# Patient Record
Sex: Female | Born: 1985 | Race: White | Hispanic: No | Marital: Married | Smoking: Never smoker
Health system: Southern US, Community
[De-identification: ages and names within clinical notes are randomized; demographics above are authoritative.]

## PROBLEM LIST (undated history)

## (undated) ENCOUNTER — Inpatient Hospital Stay (HOSPITAL_COMMUNITY): Payer: Self-pay

## (undated) DIAGNOSIS — Z789 Other specified health status: Secondary | ICD-10-CM

## (undated) HISTORY — PX: NO PAST SURGERIES: SHX2092

---

## 2007-05-04 ENCOUNTER — Ambulatory Visit (HOSPITAL_COMMUNITY): Admission: RE | Admit: 2007-05-04 | Discharge: 2007-05-04 | Payer: Self-pay | Admitting: Obstetrics & Gynecology

## 2007-05-10 ENCOUNTER — Inpatient Hospital Stay (HOSPITAL_COMMUNITY): Admission: AD | Admit: 2007-05-10 | Discharge: 2007-05-12 | Payer: Self-pay | Admitting: Obstetrics and Gynecology

## 2007-05-13 ENCOUNTER — Encounter: Admission: RE | Admit: 2007-05-13 | Discharge: 2007-06-01 | Payer: Self-pay | Admitting: Obstetrics and Gynecology

## 2007-06-01 ENCOUNTER — Ambulatory Visit: Admission: RE | Admit: 2007-06-01 | Discharge: 2007-06-01 | Payer: Self-pay | Admitting: Obstetrics and Gynecology

## 2007-06-04 ENCOUNTER — Ambulatory Visit: Admission: RE | Admit: 2007-06-04 | Discharge: 2007-06-04 | Payer: Self-pay | Admitting: Obstetrics and Gynecology

## 2007-09-17 ENCOUNTER — Encounter: Admission: RE | Admit: 2007-09-17 | Discharge: 2007-09-17 | Payer: Self-pay | Admitting: Obstetrics and Gynecology

## 2007-10-22 ENCOUNTER — Encounter: Admission: RE | Admit: 2007-10-22 | Discharge: 2007-10-22 | Payer: Self-pay | Admitting: Obstetrics and Gynecology

## 2008-11-30 ENCOUNTER — Inpatient Hospital Stay (HOSPITAL_COMMUNITY): Admission: AD | Admit: 2008-11-30 | Discharge: 2008-12-02 | Payer: Self-pay | Admitting: Obstetrics and Gynecology

## 2009-07-11 IMAGING — US US OB LIMITED
1 series · 14 of 19 positions shown · non-contrast
Comparison: none

OBSTETRICAL ULTRASOUND:

 This ultrasound exam was performed in the [HOSPITAL] Ultrasound Department.  The OB US report was generated in the AS system, and faxed to the ordering physician.  This report is also available in [REDACTED] PACS.

[Series 1: us ob limited · 0.30mm/px · 14 of 19 slices shown]
[im 1/19]
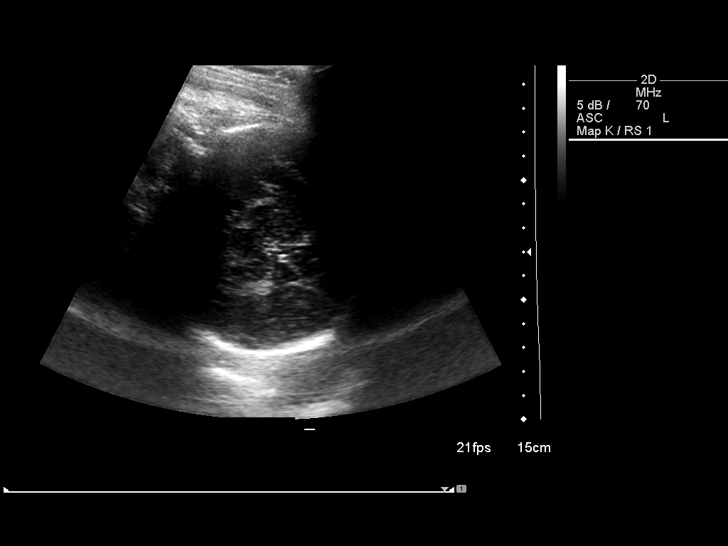
[im 3/19]
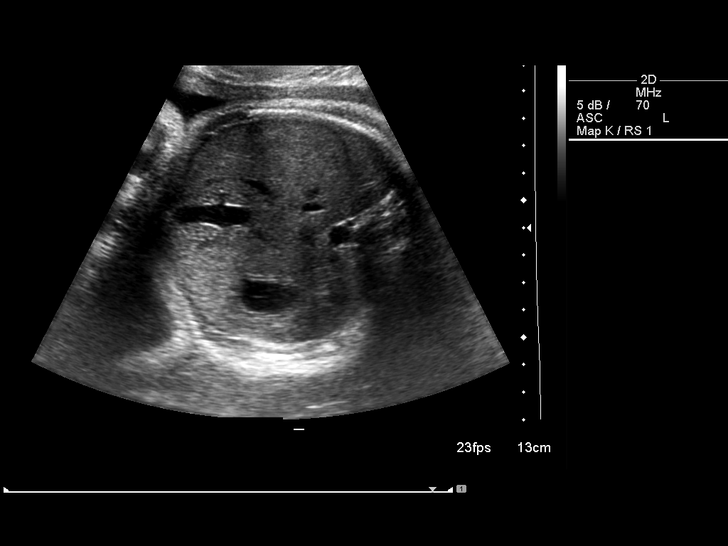
[im 4/19]
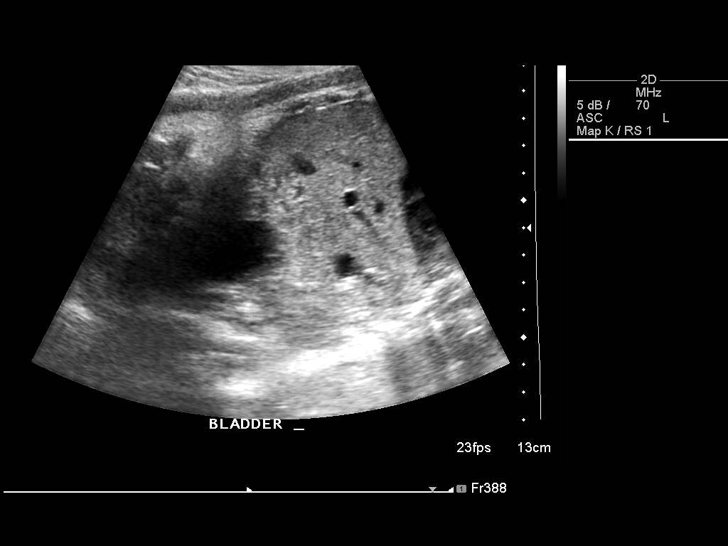
[im 5/19]
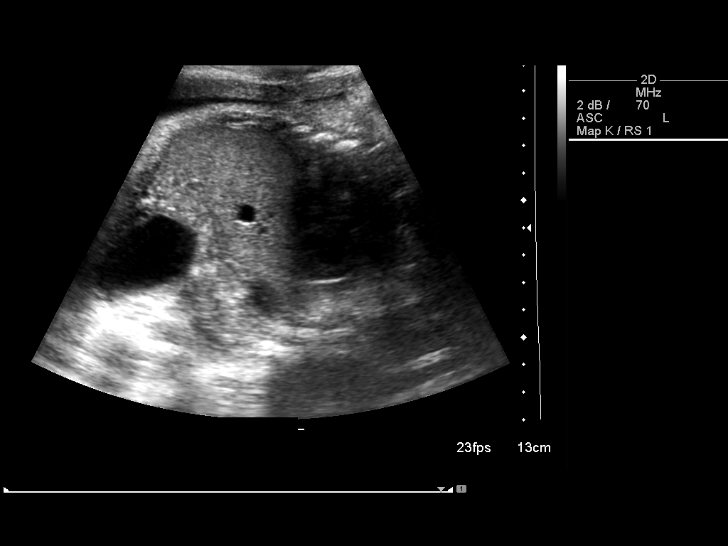
[im 7/19]
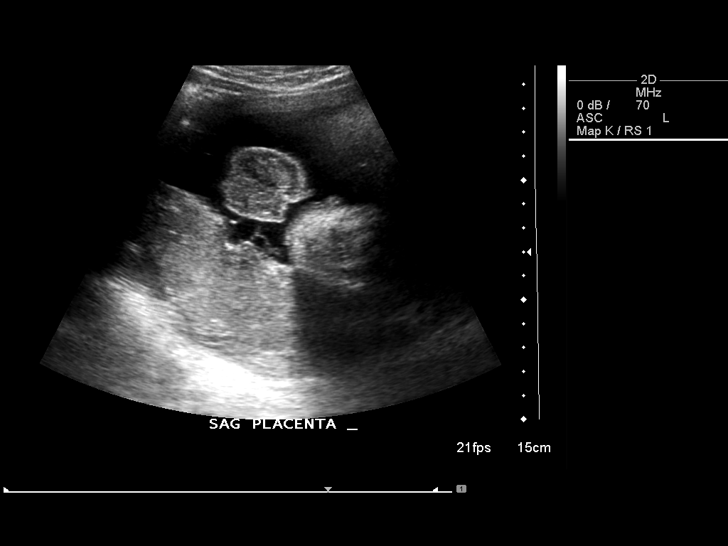
[im 8/19]
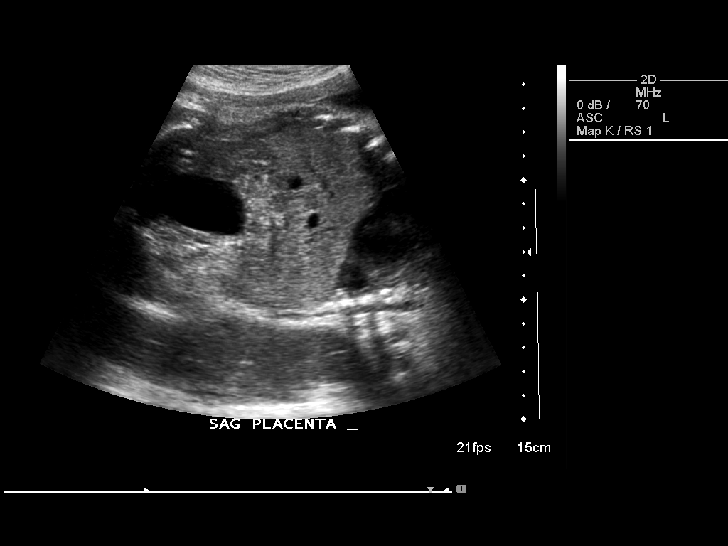
[im 9/19]
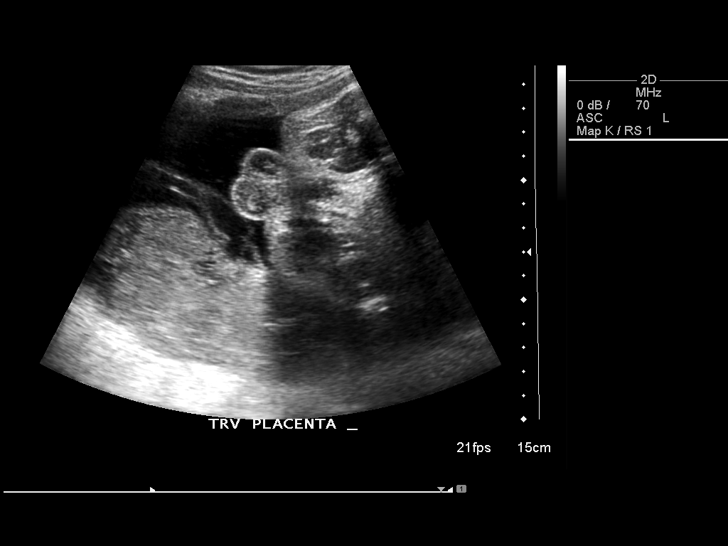
[im 11/19]
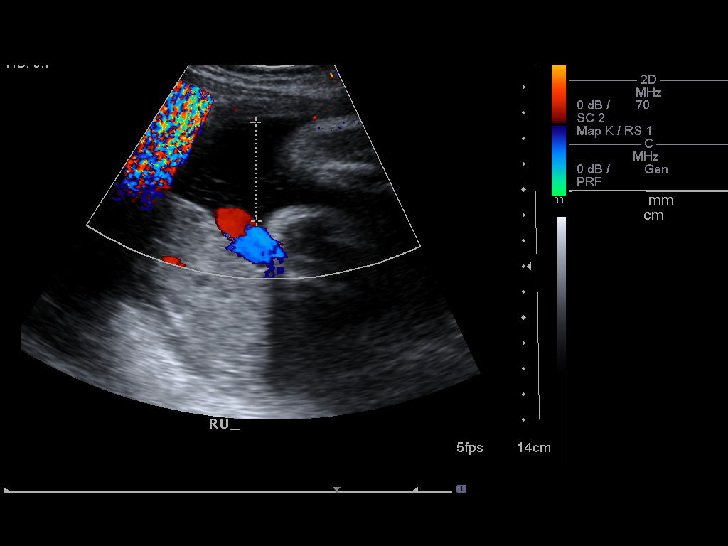
[im 12/19]
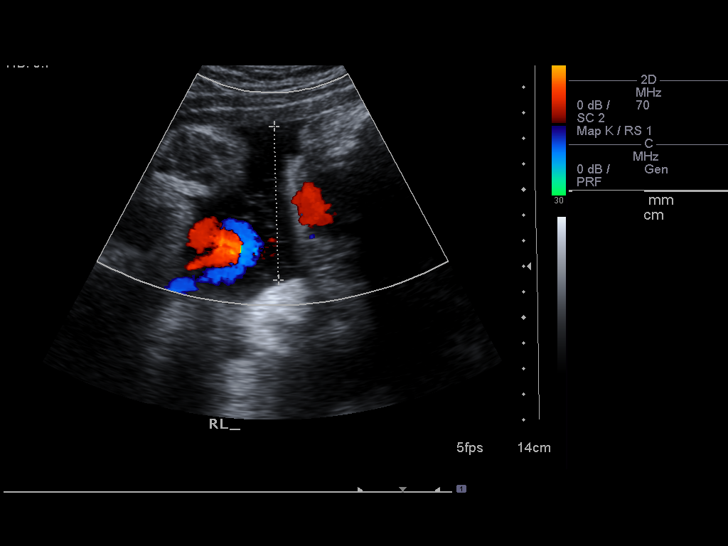
[im 13/19]
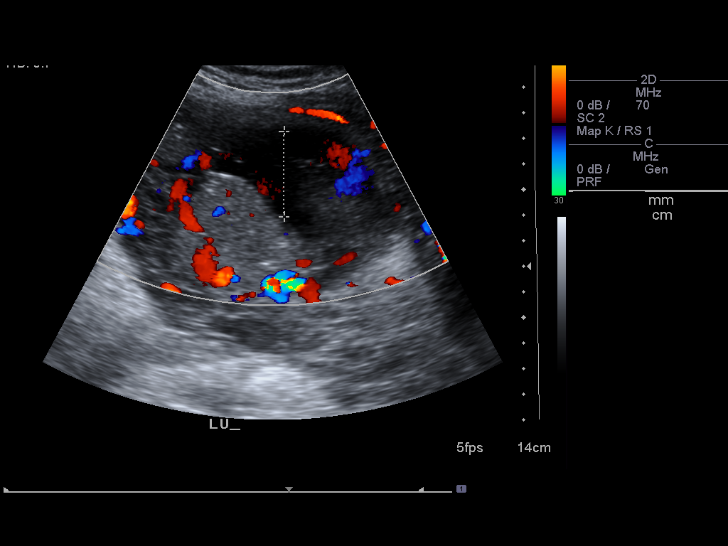
[im 15/19]
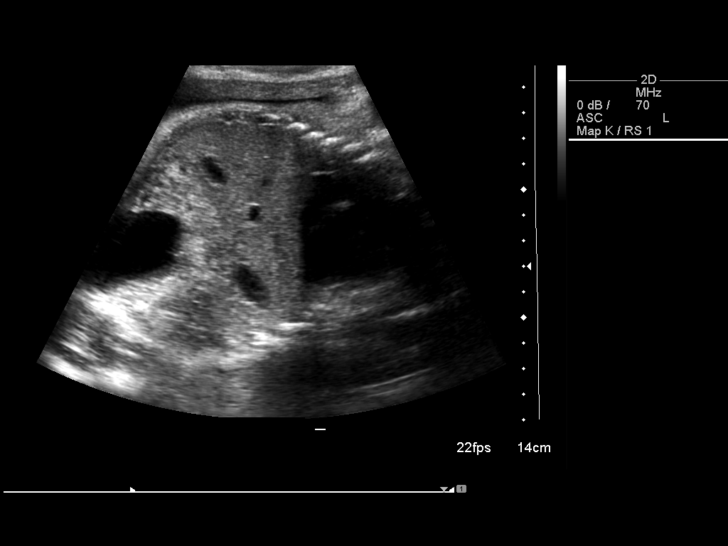
[im 16/19]
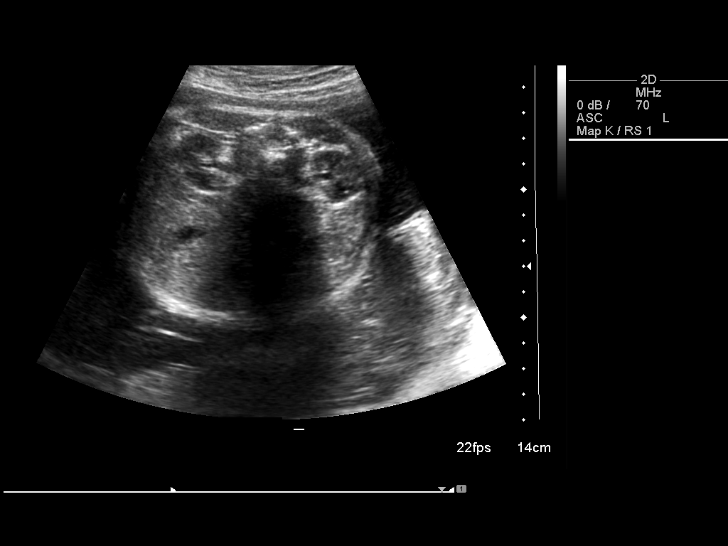
[im 17/19]
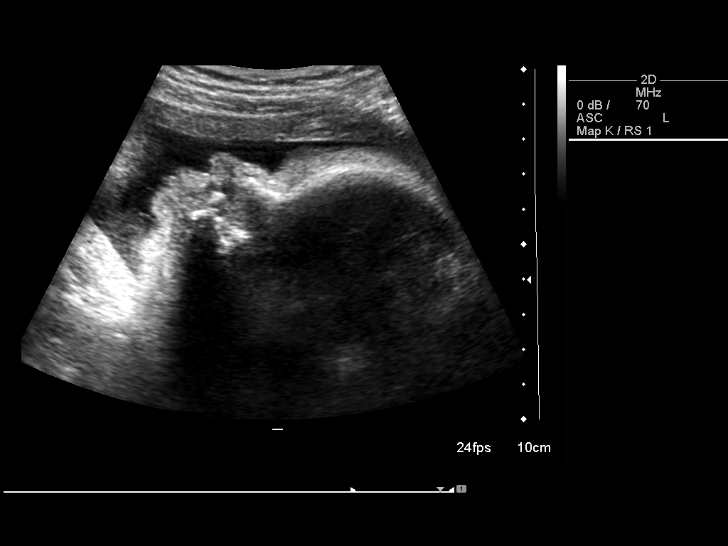
[im 19/19]
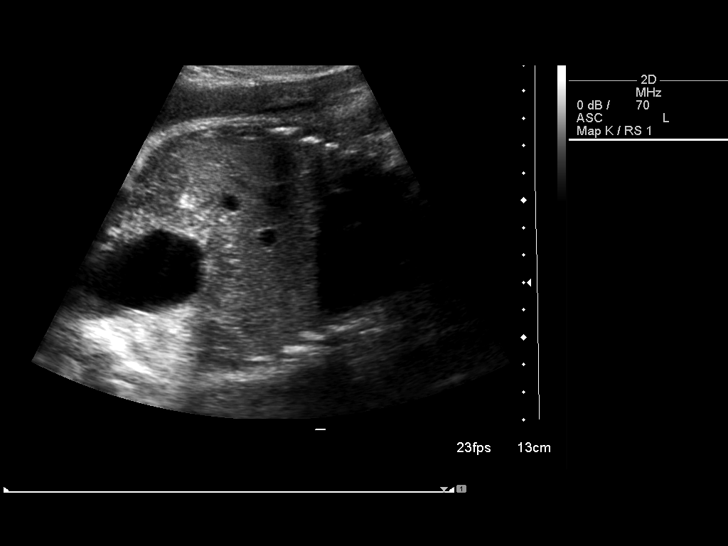

[14 of 19 positions shown; findings below may reference images not displayed]

IMPRESSION: See AS Obstetric US report.

## 2009-12-06 ENCOUNTER — Ambulatory Visit (HOSPITAL_COMMUNITY): Admission: RE | Admit: 2009-12-06 | Discharge: 2009-12-06 | Payer: Self-pay | Admitting: Obstetrics and Gynecology

## 2010-08-05 NOTE — L&D Delivery Note (Signed)
Progressed and had a NSD of a viable female 8 pounds 7 oz., apgars 9/9, over a first degree tear.  Placenta delivered intact.  2A/1V.  Tear repaired with 3-0 rapide.  Breast feed.

## 2010-08-27 ENCOUNTER — Encounter: Payer: Self-pay | Admitting: Obstetrics & Gynecology

## 2010-10-02 ENCOUNTER — Other Ambulatory Visit: Payer: Self-pay | Admitting: Obstetrics and Gynecology

## 2010-10-02 LAB — HEPATITIS B SURFACE ANTIGEN: Hepatitis B Surface Ag: NEGATIVE

## 2010-10-02 LAB — RPR: RPR: NONREACTIVE

## 2010-10-02 LAB — HIV ANTIBODY (ROUTINE TESTING W REFLEX): HIV: NONREACTIVE

## 2010-10-02 LAB — ANTIBODY SCREEN: Antibody Screen: NEGATIVE

## 2010-10-02 LAB — RUBELLA ANTIBODY, IGM: Rubella: IMMUNE

## 2010-10-23 LAB — CBC
HCT: 36.3 % (ref 36.0–46.0)
Hemoglobin: 12.3 g/dL (ref 12.0–15.0)
MCHC: 34 g/dL (ref 30.0–36.0)
MCV: 88.7 fL (ref 78.0–100.0)
Platelets: 133 10*3/uL — ABNORMAL LOW (ref 150–400)
RBC: 4.09 MIL/uL (ref 3.87–5.11)
RDW: 13.9 % (ref 11.5–15.5)
WBC: 6.4 10*3/uL (ref 4.0–10.5)

## 2010-10-23 LAB — ABO/RH: ABO/RH(D): O POS

## 2010-11-14 LAB — CBC
HCT: 31.1 % — ABNORMAL LOW (ref 36.0–46.0)
HCT: 36.3 % (ref 36.0–46.0)
Hemoglobin: 10.6 g/dL — ABNORMAL LOW (ref 12.0–15.0)
Hemoglobin: 12.3 g/dL (ref 12.0–15.0)
MCHC: 34 g/dL (ref 30.0–36.0)
MCHC: 34.2 g/dL (ref 30.0–36.0)
MCV: 91.1 fL (ref 78.0–100.0)
MCV: 91.4 fL (ref 78.0–100.0)
Platelets: 131 10*3/uL — ABNORMAL LOW (ref 150–400)
Platelets: 139 10*3/uL — ABNORMAL LOW (ref 150–400)
RBC: 3.4 MIL/uL — ABNORMAL LOW (ref 3.87–5.11)
RBC: 3.99 MIL/uL (ref 3.87–5.11)
RDW: 13.7 % (ref 11.5–15.5)
RDW: 13.8 % (ref 11.5–15.5)
WBC: 10.5 10*3/uL (ref 4.0–10.5)
WBC: 14.8 10*3/uL — ABNORMAL HIGH (ref 4.0–10.5)

## 2010-11-14 LAB — RPR: RPR Ser Ql: NONREACTIVE

## 2010-12-18 NOTE — Discharge Summary (Signed)
NAMEKAMAREE, BERKEL               ACCOUNT NO.:  [id number]   MEDICAL RECORD NO.:  [id number]          PATIENT TYPE:  INP   LOCATION:  9106                          FACILITY:  [facility]   PHYSICIAN:  Gerrit Friends. Aldona Bar, M.D.   DATE OF BIRTH:  1986-01-04   DATE OF ADMISSION:  11/30/2008  DATE OF DISCHARGE:  12/02/2008                               DISCHARGE SUMMARY   DISCHARGE DIAGNOSES:  1. Term pregnancy, delivered, 7 pounds and 11 ounces female infant,      Apgars 8 and 9.  2. Blood type O positive.  3. Positive group B strep antenatally.   PROCEDURE:  1. Normal spontaneous delivery.  2. First-degree tear and repair.   SUMMARY:  This 25 year old gravida 3, now para 2 was admitted at 44 plus  weeks' gestation in labor.  Her pregnancy was relatively benign.  She  was treated with intrapartum antibiotics because of group B strep  positive status.  She progressed and subsequently had a normal  spontaneous delivery with 7 pounds and 11 ounces female infant over a  first-degree tear which was repaired without difficulty and her  postpartum course was totally benign.  On the morning of December 02, 2008,  she was ambulating well, tolerating a regular diet well, having normal  bowel and bladder function, was breast-feeding without difficulty, was  desirous for discharge.  Accordingly, she was given all appropriate  instructions and understood all instructions well.  She was given a  discharge brochure at the time of discharge.   MEDICATIONS AT DISCHARGE:  1. Tylenol or Advil as needed for discomfort.  2. She will continue her vitamins - one a day as long as she is breast      feeding.  3. Will use Feosol capsules - one approximately 4 times a week for      mild anemia - her CBC at the time of discharge was hemoglobin 10.6,      white count 14,800, and platelet count 139,000.   She will return to the office in approximately 4 weeks' time for  followup or as needed.   CONDITION ON DISCHARGE:   Improved.      Gerrit Friends. Aldona Bar, M.D.  Electronically Signed     RMW/MEDQ  D:  12/02/2008  T:  12/02/2008  Job:  [id number]

## 2011-01-16 LAB — RPR: RPR: NONREACTIVE

## 2011-04-02 LAB — STREP B DNA PROBE: GBS: POSITIVE

## 2011-04-11 ENCOUNTER — Inpatient Hospital Stay (HOSPITAL_COMMUNITY): Payer: BC Managed Care – PPO | Admitting: Anesthesiology

## 2011-04-11 ENCOUNTER — Encounter (HOSPITAL_COMMUNITY): Payer: Self-pay | Admitting: Anesthesiology

## 2011-04-11 ENCOUNTER — Inpatient Hospital Stay (HOSPITAL_COMMUNITY)
Admission: AD | Admit: 2011-04-11 | Discharge: 2011-04-12 | DRG: 373 | Disposition: A | Discharge status: Home / Self Care | Payer: BC Managed Care – PPO | Source: Ambulatory Visit | Attending: Obstetrics & Gynecology | Admitting: Obstetrics & Gynecology

## 2011-04-11 ENCOUNTER — Encounter (HOSPITAL_COMMUNITY): Payer: Self-pay

## 2011-04-11 DIAGNOSIS — Z22330 Carrier of Group B streptococcus: Secondary | ICD-10-CM

## 2011-04-11 DIAGNOSIS — O99892 Other specified diseases and conditions complicating childbirth: Secondary | ICD-10-CM | POA: Diagnosis present

## 2011-04-11 LAB — CBC
HCT: 37 % (ref 36.0–46.0)
Hemoglobin: 12.1 g/dL (ref 12.0–15.0)
MCH: 29.1 pg (ref 26.0–34.0)
MCHC: 32.7 g/dL (ref 30.0–36.0)
MCV: 88.9 fL (ref 78.0–100.0)
Platelets: 133 10*3/uL — ABNORMAL LOW (ref 150–400)
RBC: 4.16 MIL/uL (ref 3.87–5.11)
RDW: 14.1 % (ref 11.5–15.5)
WBC: 9.4 10*3/uL (ref 4.0–10.5)

## 2011-04-11 LAB — RPR: RPR Ser Ql: NONREACTIVE

## 2011-04-11 MED ORDER — CITRIC ACID-SODIUM CITRATE 334-500 MG/5ML PO SOLN: 30.0000 mL | ORAL | Status: DC | PRN

## 2011-04-11 MED ORDER — OXYTOCIN BOLUS FROM INFUSION
500.0000 mL | Freq: Once | INTRAVENOUS | Status: DC
  Filled 2011-04-11: qty 1000
  Filled 2011-04-11: qty 500

## 2011-04-11 MED ORDER — DIPHENHYDRAMINE HCL 50 MG/ML IJ SOLN: 12.5000 mg | INTRAMUSCULAR | Status: DC | PRN

## 2011-04-11 MED ORDER — PENICILLIN G POTASSIUM 5000000 UNITS IJ SOLR
5.0000 10*6.[IU] | Freq: Once | INTRAMUSCULAR | Status: DC
  Filled 2011-04-11: qty 5

## 2011-04-11 MED ORDER — OXYCODONE-ACETAMINOPHEN 5-325 MG PO TABS: 2.0000 | ORAL_TABLET | ORAL | Status: DC | PRN

## 2011-04-11 MED ORDER — LIDOCAINE HCL (PF) 1 % IJ SOLN
30.0000 mL | INTRAMUSCULAR | Status: DC | PRN
  Filled 2011-04-11 (×2): qty 30

## 2011-04-11 MED ORDER — ONDANSETRON HCL 4 MG/2ML IJ SOLN: 4.0000 mg | INTRAMUSCULAR | Status: DC | PRN

## 2011-04-11 MED ORDER — OXYTOCIN 20 UNITS IN LACTATED RINGERS INFUSION - SIMPLE: 1.0000 m[IU]/min | INTRAVENOUS | Status: DC

## 2011-04-11 MED ORDER — LACTATED RINGERS IV SOLN
500.0000 mL | Freq: Once | INTRAVENOUS | Status: AC
  Administered 2011-04-11: 1000 mL via INTRAVENOUS

## 2011-04-11 MED ORDER — LACTATED RINGERS IV SOLN
INTRAVENOUS | Status: DC
  Administered 2011-04-11: 13:00:00 via INTRAVENOUS

## 2011-04-11 MED ORDER — PENICILLIN G POTASSIUM 5000000 UNITS IJ SOLR
2.5000 10*6.[IU] | INTRAVENOUS | Status: DC
  Filled 2011-04-11 (×4): qty 2.5

## 2011-04-11 MED ORDER — FLEET ENEMA 7-19 GM/118ML RE ENEM: 1.0000 | ENEMA | RECTAL | Status: DC | PRN

## 2011-04-11 MED ORDER — FENTANYL 2.5 MCG/ML BUPIVACAINE 1/10 % EPIDURAL INFUSION (WH - ANES)
14.0000 mL/h | INTRAMUSCULAR | Status: DC
  Administered 2011-04-11: 14 mL/h via EPIDURAL
  Filled 2011-04-11: qty 60

## 2011-04-11 MED ORDER — WITCH HAZEL-GLYCERIN EX PADS: 1.0000 "application " | MEDICATED_PAD | CUTANEOUS | Status: DC | PRN

## 2011-04-11 MED ORDER — ZOLPIDEM TARTRATE 5 MG PO TABS: 5.0000 mg | ORAL_TABLET | Freq: Every evening | ORAL | Status: DC | PRN

## 2011-04-11 MED ORDER — LANOLIN HYDROUS EX OINT: TOPICAL_OINTMENT | CUTANEOUS | Status: DC | PRN

## 2011-04-11 MED ORDER — TERBUTALINE SULFATE 1 MG/ML IJ SOLN: 0.2500 mg | Freq: Once | INTRAMUSCULAR | Status: DC | PRN

## 2011-04-11 MED ORDER — PHENYLEPHRINE 40 MCG/ML (10ML) SYRINGE FOR IV PUSH (FOR BLOOD PRESSURE SUPPORT)
80.0000 ug | PREFILLED_SYRINGE | INTRAVENOUS | Status: DC | PRN
  Filled 2011-04-11 (×2): qty 5

## 2011-04-11 MED ORDER — SIMETHICONE 80 MG PO CHEW: 80.0000 mg | CHEWABLE_TABLET | ORAL | Status: DC | PRN

## 2011-04-11 MED ORDER — PHENYLEPHRINE 40 MCG/ML (10ML) SYRINGE FOR IV PUSH (FOR BLOOD PRESSURE SUPPORT)
80.0000 ug | PREFILLED_SYRINGE | INTRAVENOUS | Status: DC | PRN
  Filled 2011-04-11: qty 5

## 2011-04-11 MED ORDER — ONDANSETRON HCL 4 MG PO TABS: 4.0000 mg | ORAL_TABLET | ORAL | Status: DC | PRN

## 2011-04-11 MED ORDER — SODIUM BICARBONATE 8.4 % IV SOLN
INTRAVENOUS | Status: DC | PRN
  Administered 2011-04-11: 5 mL via EPIDURAL

## 2011-04-11 MED ORDER — IBUPROFEN 600 MG PO TABS
600.0000 mg | ORAL_TABLET | Freq: Four times a day (QID) | ORAL | Status: DC
  Administered 2011-04-12 (×3): 600 mg via ORAL
  Filled 2011-04-11 (×3): qty 1

## 2011-04-11 MED ORDER — IBUPROFEN 600 MG PO TABS
600.0000 mg | ORAL_TABLET | Freq: Four times a day (QID) | ORAL | Status: DC | PRN
  Administered 2011-04-11: 600 mg via ORAL
  Filled 2011-04-11: qty 1

## 2011-04-11 MED ORDER — SENNOSIDES-DOCUSATE SODIUM 8.6-50 MG PO TABS: 2.0000 | ORAL_TABLET | Freq: Every day | ORAL | Status: DC

## 2011-04-11 MED ORDER — LACTATED RINGERS IV SOLN: 500.0000 mL | INTRAVENOUS | Status: DC | PRN

## 2011-04-11 MED ORDER — DIPHENHYDRAMINE HCL 25 MG PO CAPS: 25.0000 mg | ORAL_CAPSULE | Freq: Four times a day (QID) | ORAL | Status: DC | PRN

## 2011-04-11 MED ORDER — PRENATAL PLUS 27-1 MG PO TABS
1.0000 | ORAL_TABLET | Freq: Every day | ORAL | Status: DC
  Administered 2011-04-12: 1 via ORAL
  Filled 2011-04-11: qty 1

## 2011-04-11 MED ORDER — EPHEDRINE 5 MG/ML INJ
10.0000 mg | INTRAVENOUS | Status: DC | PRN
  Filled 2011-04-11 (×2): qty 4

## 2011-04-11 MED ORDER — PENICILLIN G POTASSIUM 5000000 UNITS IJ SOLR: 5.0000 10*6.[IU] | INTRAMUSCULAR | Status: DC

## 2011-04-11 MED ORDER — OXYCODONE-ACETAMINOPHEN 5-325 MG PO TABS
1.0000 | ORAL_TABLET | ORAL | Status: DC | PRN
  Administered 2011-04-12: 1 via ORAL
  Filled 2011-04-11: qty 1

## 2011-04-11 MED ORDER — ACETAMINOPHEN 325 MG PO TABS: 650.0000 mg | ORAL_TABLET | ORAL | Status: DC | PRN

## 2011-04-11 MED ORDER — DIBUCAINE 1 % RE OINT: 1.0000 "application " | TOPICAL_OINTMENT | RECTAL | Status: DC | PRN

## 2011-04-11 MED ORDER — TETANUS-DIPHTH-ACELL PERTUSSIS 5-2.5-18.5 LF-MCG/0.5 IM SUSP: 0.5000 mL | Freq: Once | INTRAMUSCULAR | Status: DC

## 2011-04-11 MED ORDER — EPHEDRINE 5 MG/ML INJ
10.0000 mg | INTRAVENOUS | Status: DC | PRN
  Filled 2011-04-11: qty 4

## 2011-04-11 MED ORDER — PENICILLIN G POTASSIUM 5000000 UNITS IJ SOLR
5.0000 10*6.[IU] | Freq: Once | INTRAVENOUS | Status: AC
  Administered 2011-04-11: 5 10*6.[IU] via INTRAVENOUS
  Filled 2011-04-11: qty 5

## 2011-04-11 MED ORDER — ONDANSETRON HCL 4 MG/2ML IJ SOLN: 4.0000 mg | Freq: Four times a day (QID) | INTRAMUSCULAR | Status: DC | PRN

## 2011-04-11 MED ORDER — OXYTOCIN 20 UNITS IN LACTATED RINGERS INFUSION - SIMPLE
125.0000 mL/h | Freq: Once | INTRAVENOUS | Status: AC
  Administered 2011-04-11: 999 mL/h via INTRAVENOUS

## 2011-04-11 MED ORDER — BENZOCAINE-MENTHOL 20-0.5 % EX AERO: 1.0000 "application " | INHALATION_SPRAY | CUTANEOUS | Status: DC | PRN

## 2011-04-11 NOTE — Anesthesia Procedure Notes (Addendum)
Epidural Patient location during procedure: OB Start time: 04/11/2011 2:16 PM  Staffing Anesthesiologist: Jiles Garter  Preanesthetic Checklist Completed: patient identified, site marked, surgical consent, pre-op evaluation, timeout performed, IV checked, risks and benefits discussed and monitors and equipment checked  Epidural Patient position: sitting Prep: site prepped and draped and DuraPrep Patient monitoring: continuous pulse ox and blood pressure Approach: midline Injection technique: LOR air  Needle:  Needle type: Tuohy  Needle gauge: 17 G Needle length: 9 cm Needle insertion depth: 5 cm cm Catheter type: closed end flexible Catheter size: 19 Gauge Catheter at skin depth: 10 cm Test dose: negative  Assessment Events: blood not aspirated, injection not painful, no injection resistance, negative IV test and no paresthesia  Additional Notes Dosing of Epidural: 1st dose, through needle...... ( mg expressed as equavilent  cc's medication  from .1%Bupiv / fentanyl syringe from L&D pump)...............  5mg  Marcaine  2nd dose, through catheter after waiting 3 minutes.... epi 1:200K + Xylocaine 40 mg 3rd dose, through catheter, after waiting 3 minutes...Marland KitchenMarland Kitchenepi 1:200K + Xylocaine 60 mg ( 2% Xylo charted as a single dose in Epic Meds for ease of charting; actual dosing was fractionated as above, for saftey's sake)  As each dose occurred, patient was free of IV sx; and patient exhibited no evidence of SA injection.  Patient is more comfortable after epidural dosed. Please see RN's note for documentation of vital signs,and FHR which are stable.

## 2011-04-11 NOTE — Progress Notes (Signed)
First dose of IV PCN in, epidural in, and patient comfortable.  My exam -- Cx 6-/90/ vtx -1-2 and contraction pattern to me poor.  Suggested AROM and/or pitocin augmentation.  Awaiting decision of patient and husband.

## 2011-04-11 NOTE — Anesthesia Preprocedure Evaluation (Signed)
Anesthesia Evaluation  Name, MR# and DOB Patient awake  General Assessment Comment  Reviewed: Allergy & Precautions, H&P , Patient's Chart, lab work & pertinent test results  Airway Mallampati: II TM Distance: >3 FB Neck ROM: full    Dental  (+) Teeth Intact   Pulmonary  clear to auscultation  breath sounds clear to auscultation none    Cardiovascular regular Normal    Neuro/Psych   GI/Hepatic/Renal   Endo/Other    Abdominal   Musculoskeletal   Hematology   Peds  Reproductive/Obstetrics (+) Pregnancy    Anesthesia Other Findings                 Anesthesia Physical Anesthesia Plan  ASA: II  Anesthesia Plan: Epidural   Post-op Pain Management:    Induction:   Airway Management Planned:   Additional Equipment:   Intra-op Plan:   Post-operative Plan:   Informed Consent: I have reviewed the patients History and Physical, chart, labs and discussed the procedure including the risks, benefits and alternatives for the proposed anesthesia with the patient or authorized representative who has indicated his/her understanding and acceptance.   Dental Advisory Given  Plan Discussed with: CRNA and Surgeon  Anesthesia Plan Comments: (Labs checked- platelets confirmed with RN in room. Fetal heart tracing, per RN, reportedly stable enough for sitting procedure. Discussed epidural, and patient consents to the procedure:  included risk of possible headache,backache, failed block, allergic reaction, and nerve injury. This patient was asked if she had any questions or concerns before the procedure started. )        Anesthesia Quick Evaluation

## 2011-04-11 NOTE — H&P (Signed)
  25 year old G5P2 at 39+ weeks in labor.  +GBS in urine.  Otherwise benign pregnancy.  Started contracting about 4 hours ago.  PE:  VS stable Abd:  EFW 7-8 pounds; FH reactive; Vtx CX:  (by nurse) 6/100/vtx  IMP:  Term IUP, active labor, +GBS PLAN:  IV PCN, epidural as requested, expectant management.

## 2011-04-11 NOTE — Progress Notes (Signed)
Pt states contractions every 4-10 minutes. No bleeding or leaking and reports good fetal movement,.

## 2011-04-11 NOTE — Progress Notes (Deleted)
Pt states she has had no prenatal care. Has been seen in MAU before. Has had a cold for about 3 days and having R upper chest and arm pain, lower abdominal pain that is worse with movement. No bleeding or leaking fluid and has good fetal movement.

## 2011-04-12 LAB — CBC
HCT: 33.4 % — ABNORMAL LOW (ref 36.0–46.0)
Hemoglobin: 11 g/dL — ABNORMAL LOW (ref 12.0–15.0)
MCH: 29.4 pg (ref 26.0–34.0)
MCHC: 32.9 g/dL (ref 30.0–36.0)
MCV: 89.3 fL (ref 78.0–100.0)
Platelets: 138 10*3/uL — ABNORMAL LOW (ref 150–400)
RBC: 3.74 MIL/uL — ABNORMAL LOW (ref 3.87–5.11)
RDW: 14.2 % (ref 11.5–15.5)
WBC: 12.6 10*3/uL — ABNORMAL HIGH (ref 4.0–10.5)

## 2011-04-12 MED ORDER — HYDROCODONE-ACETAMINOPHEN 5-500 MG PO TABS: 1.0000 | ORAL_TABLET | ORAL | Status: AC | PRN

## 2011-04-12 MED ORDER — IBUPROFEN 600 MG PO TABS: 600.0000 mg | ORAL_TABLET | Freq: Four times a day (QID) | ORAL | Status: AC | PRN

## 2011-04-12 NOTE — Progress Notes (Signed)
Post Partum Day 1 Subjective: no complaints, up ad lib, voiding, tolerating PO and + flatus Patient desires early discharge home today once the baby is 24 hours old  Objective: Blood pressure 93/65, pulse 87, temperature 97.8 F (36.6 C), temperature source Oral, resp. rate 18, height 5\' 2"  (1.575 m), weight 70.308 kg (155 lb), SpO2 98.00%, unknown if currently breastfeeding.  Physical Exam:  General: alert, cooperative, appears stated age and no distress Lochia: appropriate Uterine Fundus: firm DVT Evaluation: No evidence of DVT seen on physical exam.   Basename 04/12/11 0510 04/11/11 1312  HGB 11.0* 12.1  HCT 33.4* 37.0    Assessment/Plan: Discharge home and Breastfeeding   LOS: 1 day   Temitope Flammer H. 04/12/2011, 10:45 AM

## 2011-04-12 NOTE — Anesthesia Postprocedure Evaluation (Signed)
  Anesthesia Post-op Note  Patient: Monica Mathis  Procedure(s) Performed: * No procedures listed *  Patient Location: PACU and Mother/Baby  Anesthesia Type: Epidural  Level of Consciousness: awake, alert  and oriented  Airway and Oxygen Therapy: Patient Spontanous Breathing  Post-op Pain: mild  Post-op Assessment: Patient's Cardiovascular Status Stable and Respiratory Function Stable  Post-op Vital Signs: stable  Complications: No apparent anesthesia complications

## 2011-04-12 NOTE — Discharge Summary (Signed)
Obstetric Discharge Summary Reason for Admission: onset of labor Prenatal Procedures: ultrasound Intrapartum Procedures: spontaneous vaginal delivery Postpartum Procedures: none Complications-Operative and Postpartum: 1st degree perineal laceration Hemoglobin  Date Value Range Status  04/12/2011 11.0* 12.0-15.0 (g/dL) Final     HCT  Date Value Range Status  04/12/2011 33.4* 36.0-46.0 (%) Final    Discharge Diagnoses: Term Pregnancy-delivered  Discharge Information: Date: 04/12/2011 Activity: pelvic rest Diet: routine Medications: PNV, Ibuprophen and Viocodin Condition: stable Instructions: refer to practice specific booklet Discharge to: home Follow-up Information    Follow up with Caprice Beaver. Make an appointment in 4 weeks. (for a postpartum evaluation)    Contact information:   [address] [place] [address] [phone]          Newborn Data: Live born female  Birth Weight: 8 lb 7.3 oz (3835 g) APGAR: 9, 9  Home with mother.  Nicolas Banh H. 04/12/2011, 10:54 AM

## 2011-04-17 ENCOUNTER — Inpatient Hospital Stay (HOSPITAL_COMMUNITY): Admission: AD | Admit: 2011-04-17 | Payer: Self-pay | Source: Ambulatory Visit | Admitting: Obstetrics and Gynecology

## 2011-05-16 LAB — CBC
HCT: 30 — ABNORMAL LOW
HCT: 35.7 — ABNORMAL LOW
Hemoglobin: 10.3 — ABNORMAL LOW
Hemoglobin: 12.1
MCHC: 34
MCHC: 34.2
MCV: 89.3
MCV: 89.3
Platelets: 125 — ABNORMAL LOW
Platelets: 137 — ABNORMAL LOW
RBC: 3.35 — ABNORMAL LOW
RBC: 3.99
RDW: 13.4
RDW: 13.4
WBC: 10.5
WBC: 15.1 — ABNORMAL HIGH

## 2011-05-16 LAB — RPR: RPR Ser Ql: NONREACTIVE

## 2012-02-13 IMAGING — US US OB COMP LESS 14 WK
1 series · 14 of 16 positions shown · non-contrast
Comparison: none

OBSTETRICAL ULTRASOUND:
 This ultrasound exam was performed in the [HOSPITAL] Ultrasound Department.  The OB US report was generated in the AS system, and faxed to the ordering physician.  This report is also available in [HOSPITAL]?s [employer] and in [REDACTED] PACS.

[Series 1: us ob transvaginal · 16 acquisitions, 14 frames shown]
[im 1/16]
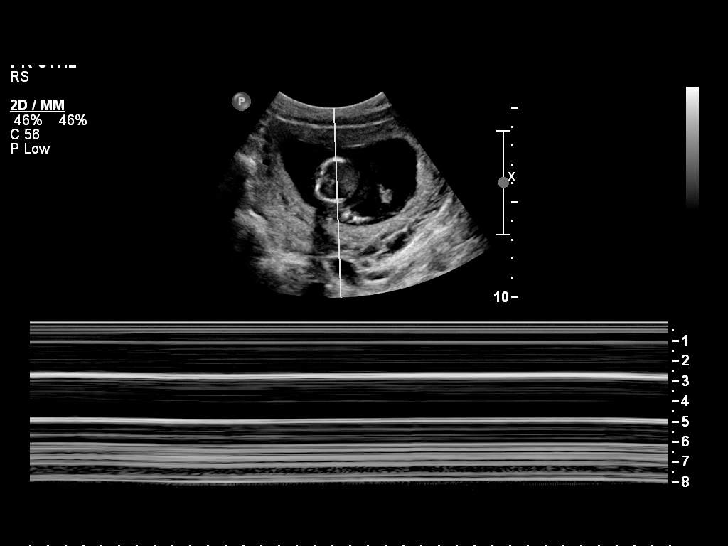
[im 2/16]
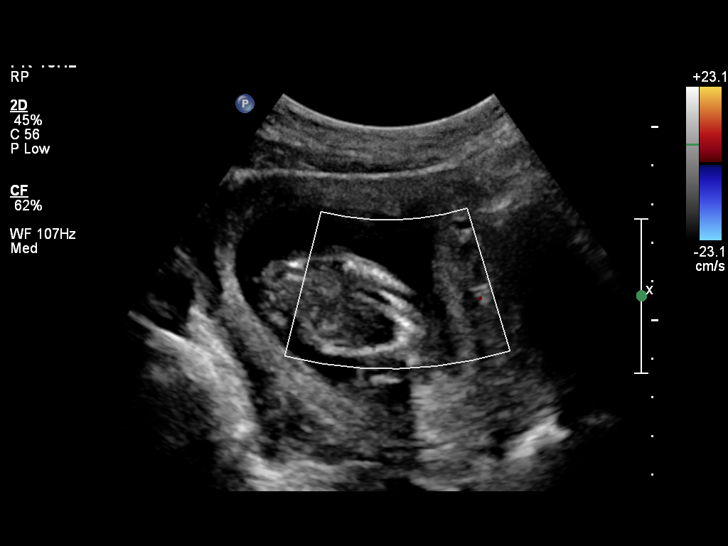
[im 3/16]
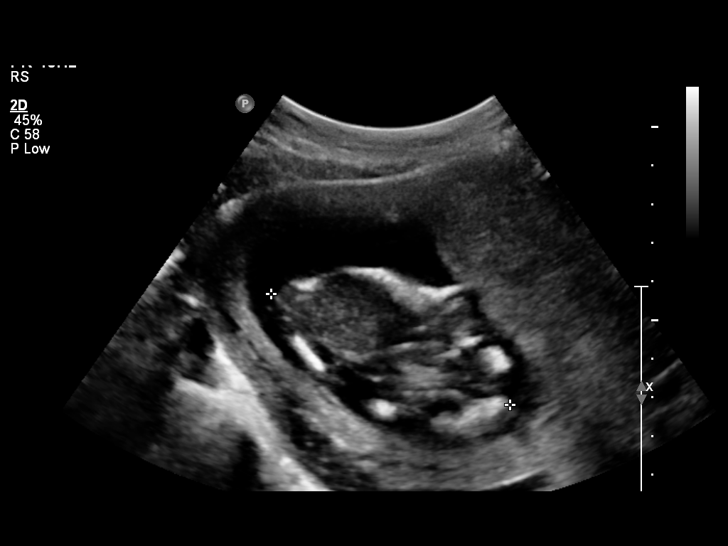
[im 5/16]
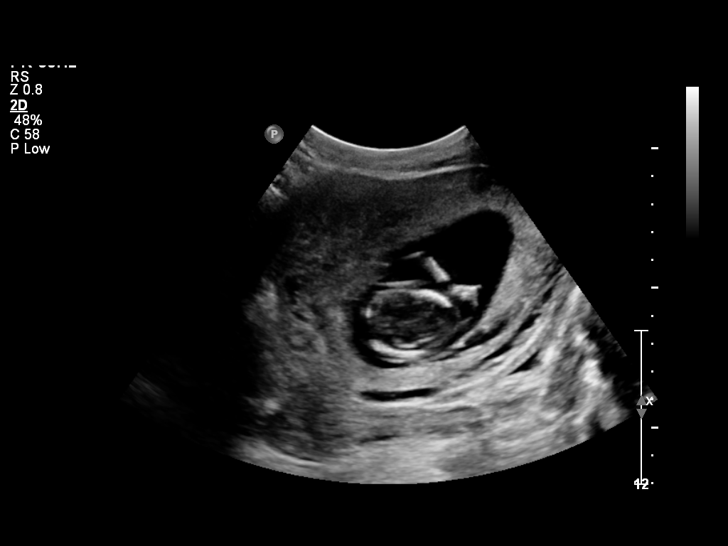
[im 6/16]
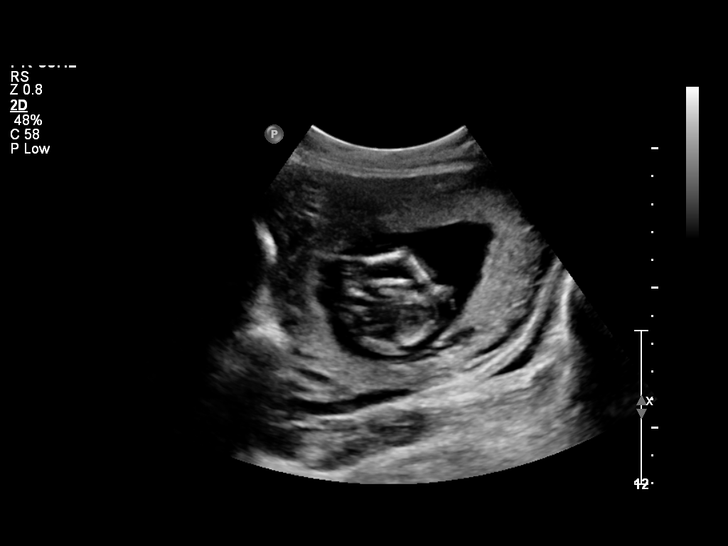
[im 7/16]
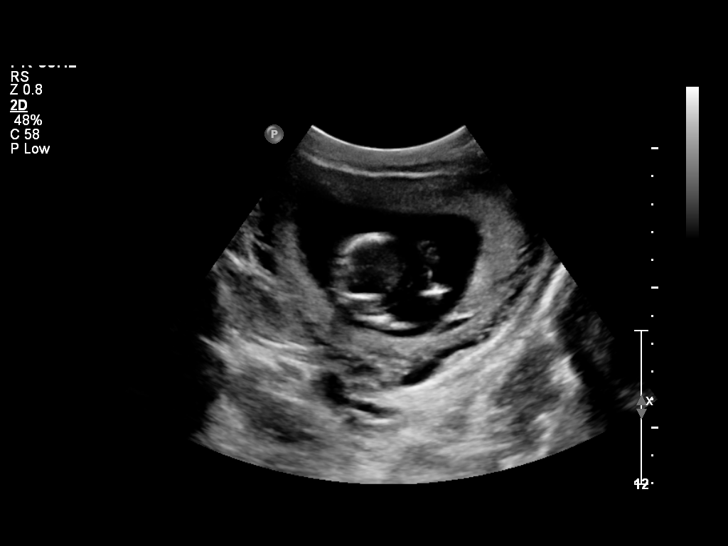
[im 8/16]
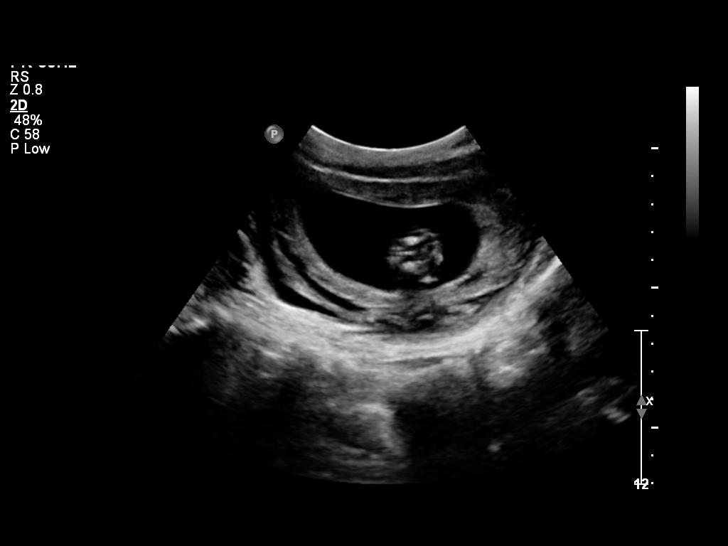
[im 9/16]
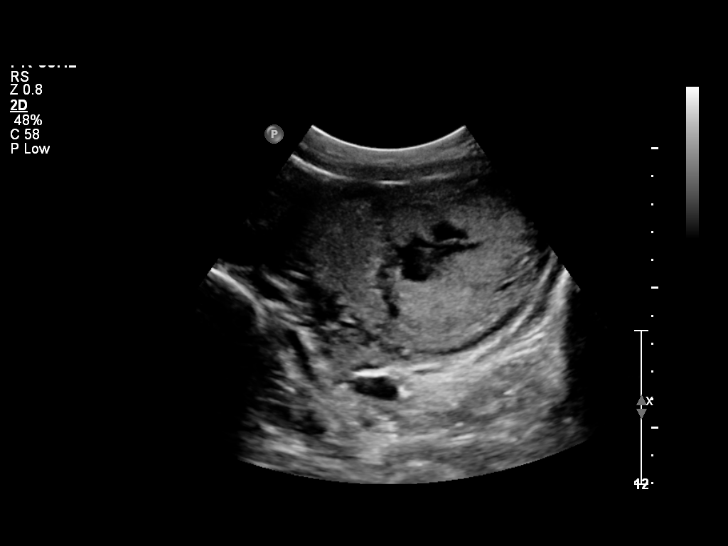
[im 10/16]
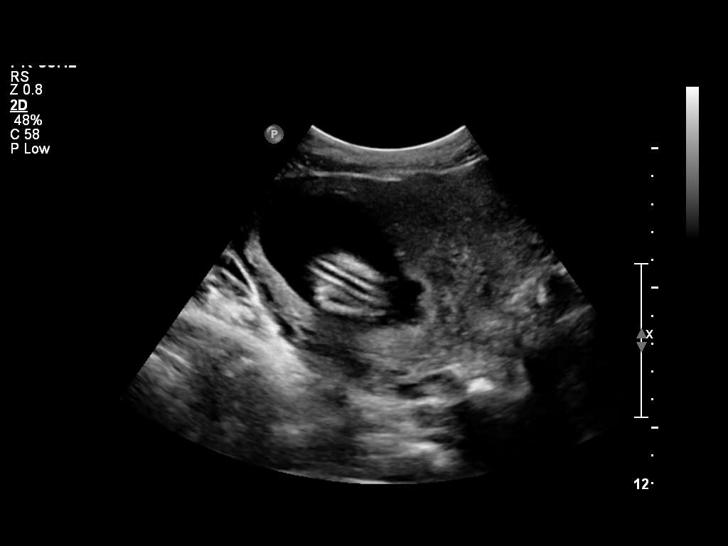
[im 11/16]
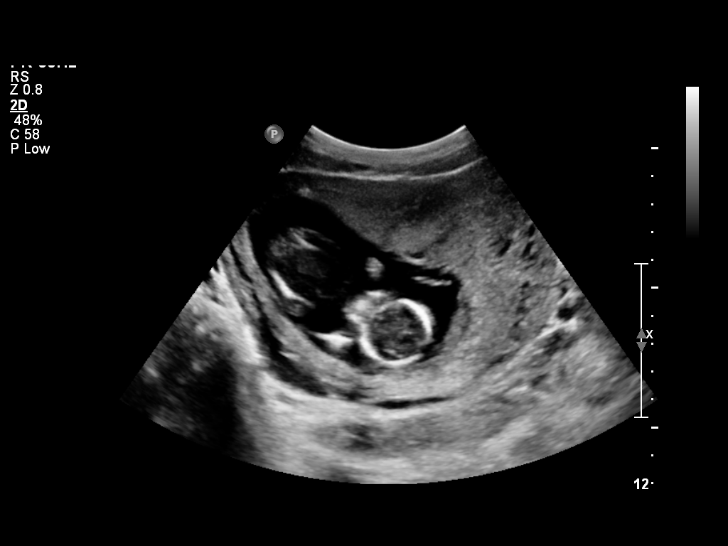
[im 13/16]
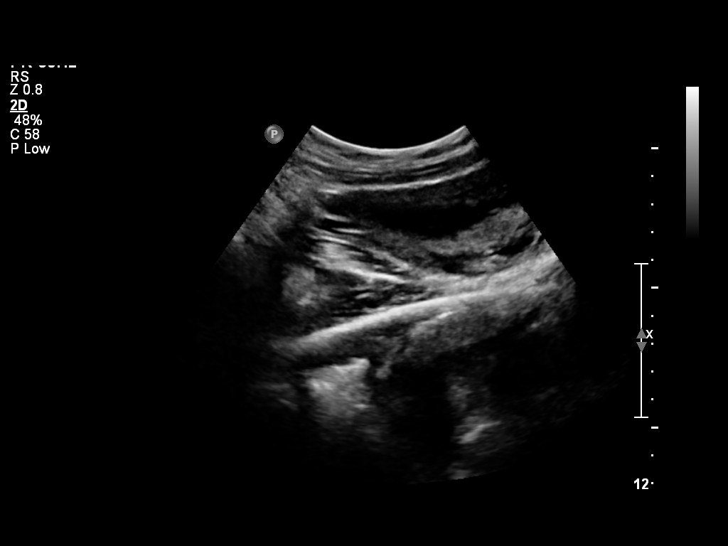
[im 14/16]
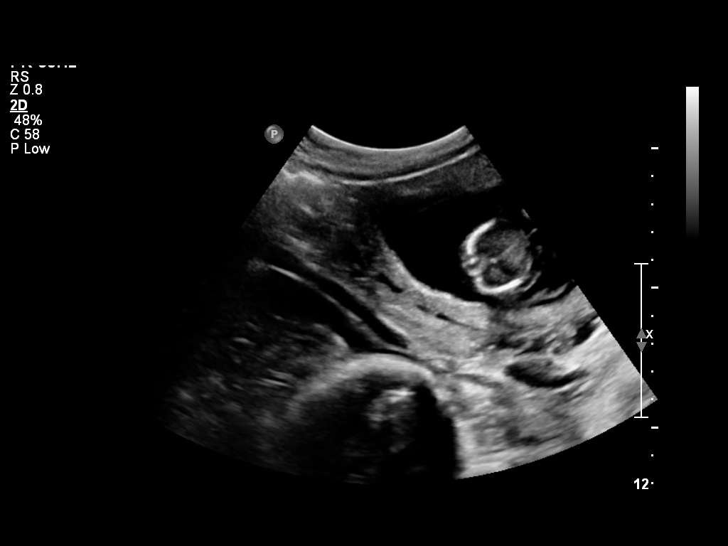
[im 15/16]
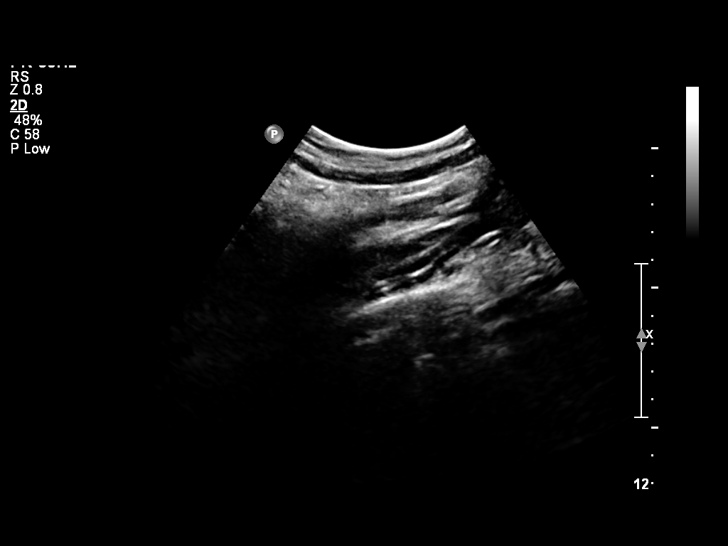
[im 16/16]
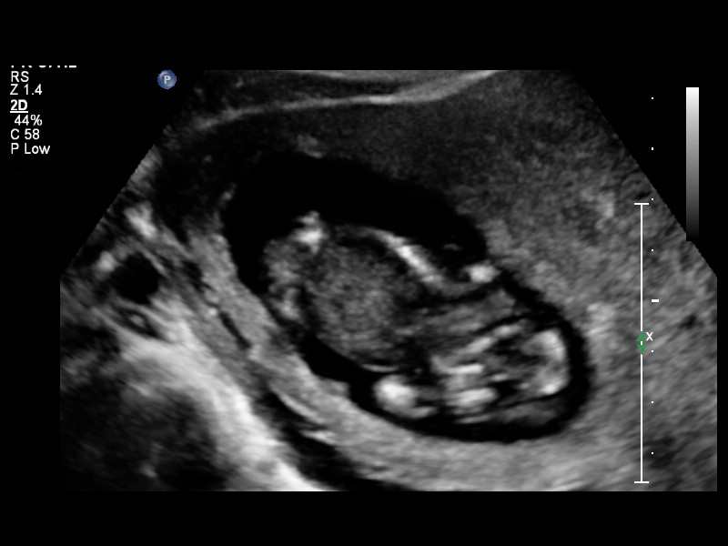

[14 of 16 positions shown; findings below may reference images not displayed]

IMPRESSION: See AS Obstetric US report.

## 2012-08-05 NOTE — L&D Delivery Note (Addendum)
Delivery Note At 8:18 PM a viable and healthy female was delivered spontaneously.  APGAR: 9, 9; weight 6 lbs 14 oz.   Placenta delivered intact with cord traction.  3 vessel cord.  Cervix inspected.  Uterus explored.  Anesthesia:  Epidural Episiotomy: None Lacerations: None Est. Blood Loss (mL): 300 ml  Mom stable and baby placed skin to skin on the maternal abdomen.  Lovena Kluck D 06/02/2013, 8:32 PM

## 2012-12-15 LAB — OB RESULTS CONSOLE HIV ANTIBODY (ROUTINE TESTING): HIV: NONREACTIVE

## 2012-12-15 LAB — OB RESULTS CONSOLE RPR: RPR: NONREACTIVE

## 2013-06-02 ENCOUNTER — Inpatient Hospital Stay (HOSPITAL_COMMUNITY)
Admission: AD | Admit: 2013-06-02 | Discharge: 2013-06-03 | DRG: 775 | Disposition: A | Discharge status: Home / Self Care | Payer: BC Managed Care – PPO | Source: Ambulatory Visit | Attending: Obstetrics and Gynecology | Admitting: Obstetrics and Gynecology

## 2013-06-02 ENCOUNTER — Encounter (HOSPITAL_COMMUNITY): Payer: BC Managed Care – PPO | Admitting: Anesthesiology

## 2013-06-02 ENCOUNTER — Encounter (HOSPITAL_COMMUNITY): Payer: Self-pay

## 2013-06-02 ENCOUNTER — Inpatient Hospital Stay (HOSPITAL_COMMUNITY): Payer: BC Managed Care – PPO | Admitting: Anesthesiology

## 2013-06-02 DIAGNOSIS — O99892 Other specified diseases and conditions complicating childbirth: Principal | ICD-10-CM | POA: Diagnosis present

## 2013-06-02 DIAGNOSIS — Z22330 Carrier of Group B streptococcus: Secondary | ICD-10-CM

## 2013-06-02 LAB — CBC
HCT: 35.7 % — ABNORMAL LOW (ref 36.0–46.0)
Hemoglobin: 12.1 g/dL (ref 12.0–15.0)
MCH: 28.9 pg (ref 26.0–34.0)
MCHC: 33.9 g/dL (ref 30.0–36.0)
MCV: 85.4 fL (ref 78.0–100.0)
Platelets: 126 10*3/uL — ABNORMAL LOW (ref 150–400)
RBC: 4.18 MIL/uL (ref 3.87–5.11)
RDW: 14.1 % (ref 11.5–15.5)
WBC: 9.9 10*3/uL (ref 4.0–10.5)

## 2013-06-02 LAB — POCT FERN TEST: POCT Fern Test: POSITIVE

## 2013-06-02 LAB — OB RESULTS CONSOLE GBS: GBS: POSITIVE

## 2013-06-02 LAB — RPR: RPR Ser Ql: NONREACTIVE

## 2013-06-02 MED ORDER — DIPHENHYDRAMINE HCL 25 MG PO CAPS: 25.0000 mg | ORAL_CAPSULE | Freq: Four times a day (QID) | ORAL | Status: DC | PRN

## 2013-06-02 MED ORDER — DIPHENHYDRAMINE HCL 50 MG/ML IJ SOLN: 12.5000 mg | INTRAMUSCULAR | Status: DC | PRN

## 2013-06-02 MED ORDER — PHENYLEPHRINE 40 MCG/ML (10ML) SYRINGE FOR IV PUSH (FOR BLOOD PRESSURE SUPPORT)
80.0000 ug | PREFILLED_SYRINGE | INTRAVENOUS | Status: DC | PRN
  Filled 2013-06-02: qty 10
  Filled 2013-06-02: qty 2

## 2013-06-02 MED ORDER — ACETAMINOPHEN 325 MG PO TABS: 650.0000 mg | ORAL_TABLET | ORAL | Status: DC | PRN

## 2013-06-02 MED ORDER — BENZOCAINE-MENTHOL 20-0.5 % EX AERO: 1.0000 "application " | INHALATION_SPRAY | CUTANEOUS | Status: DC | PRN

## 2013-06-02 MED ORDER — LIDOCAINE HCL (PF) 1 % IJ SOLN
INTRAMUSCULAR | Status: DC | PRN
  Administered 2013-06-02 (×2): 5 mL

## 2013-06-02 MED ORDER — LACTATED RINGERS IV SOLN
500.0000 mL | INTRAVENOUS | Status: DC | PRN
Start: 2013-06-02 — End: 2013-06-02

## 2013-06-02 MED ORDER — SIMETHICONE 80 MG PO CHEW: 80.0000 mg | CHEWABLE_TABLET | ORAL | Status: DC | PRN

## 2013-06-02 MED ORDER — CITRIC ACID-SODIUM CITRATE 334-500 MG/5ML PO SOLN: 30.0000 mL | ORAL | Status: DC | PRN

## 2013-06-02 MED ORDER — TERBUTALINE SULFATE 1 MG/ML IJ SOLN: 0.2500 mg | Freq: Once | INTRAMUSCULAR | Status: DC | PRN

## 2013-06-02 MED ORDER — PRENATAL MULTIVITAMIN CH
1.0000 | ORAL_TABLET | Freq: Every day | ORAL | Status: DC
  Administered 2013-06-03: 1 via ORAL
  Filled 2013-06-02: qty 1

## 2013-06-02 MED ORDER — TETANUS-DIPHTH-ACELL PERTUSSIS 5-2.5-18.5 LF-MCG/0.5 IM SUSP: 0.5000 mL | Freq: Once | INTRAMUSCULAR | Status: DC

## 2013-06-02 MED ORDER — BUTORPHANOL TARTRATE 1 MG/ML IJ SOLN: 1.0000 mg | INTRAMUSCULAR | Status: DC | PRN

## 2013-06-02 MED ORDER — FLEET ENEMA 7-19 GM/118ML RE ENEM: 1.0000 | ENEMA | RECTAL | Status: DC | PRN

## 2013-06-02 MED ORDER — SENNOSIDES-DOCUSATE SODIUM 8.6-50 MG PO TABS
2.0000 | ORAL_TABLET | ORAL | Status: DC
  Administered 2013-06-03: 2 via ORAL
  Filled 2013-06-02 (×2): qty 2

## 2013-06-02 MED ORDER — EPHEDRINE 5 MG/ML INJ
10.0000 mg | INTRAVENOUS | Status: DC | PRN
  Filled 2013-06-02: qty 2

## 2013-06-02 MED ORDER — LANOLIN HYDROUS EX OINT: TOPICAL_OINTMENT | CUTANEOUS | Status: DC | PRN

## 2013-06-02 MED ORDER — PENICILLIN G POTASSIUM 5000000 UNITS IJ SOLR
2.5000 10*6.[IU] | INTRAVENOUS | Status: DC
  Administered 2013-06-02 (×3): 2.5 10*6.[IU] via INTRAVENOUS
  Filled 2013-06-02 (×5): qty 2.5

## 2013-06-02 MED ORDER — LACTATED RINGERS IV SOLN
INTRAVENOUS | Status: DC
  Administered 2013-06-02 (×3): via INTRAVENOUS

## 2013-06-02 MED ORDER — ONDANSETRON HCL 4 MG/2ML IJ SOLN: 4.0000 mg | INTRAMUSCULAR | Status: DC | PRN

## 2013-06-02 MED ORDER — DIBUCAINE 1 % RE OINT: 1.0000 "application " | TOPICAL_OINTMENT | RECTAL | Status: DC | PRN

## 2013-06-02 MED ORDER — IBUPROFEN 600 MG PO TABS: 600.0000 mg | ORAL_TABLET | Freq: Four times a day (QID) | ORAL | Status: DC | PRN

## 2013-06-02 MED ORDER — ZOLPIDEM TARTRATE 5 MG PO TABS: 5.0000 mg | ORAL_TABLET | Freq: Every evening | ORAL | Status: DC | PRN

## 2013-06-02 MED ORDER — OXYTOCIN BOLUS FROM INFUSION
500.0000 mL | INTRAVENOUS | Status: DC
  Administered 2013-06-02: 500 mL via INTRAVENOUS

## 2013-06-02 MED ORDER — LIDOCAINE HCL (PF) 1 % IJ SOLN
30.0000 mL | INTRAMUSCULAR | Status: DC | PRN
  Filled 2013-06-02: qty 30

## 2013-06-02 MED ORDER — OXYTOCIN 40 UNITS IN LACTATED RINGERS INFUSION - SIMPLE MED: 1.0000 m[IU]/min | INTRAVENOUS | Status: DC

## 2013-06-02 MED ORDER — WITCH HAZEL-GLYCERIN EX PADS: 1.0000 "application " | MEDICATED_PAD | CUTANEOUS | Status: DC | PRN

## 2013-06-02 MED ORDER — ONDANSETRON HCL 4 MG/2ML IJ SOLN: 4.0000 mg | Freq: Four times a day (QID) | INTRAMUSCULAR | Status: DC | PRN

## 2013-06-02 MED ORDER — FENTANYL 2.5 MCG/ML BUPIVACAINE 1/10 % EPIDURAL INFUSION (WH - ANES)
14.0000 mL/h | INTRAMUSCULAR | Status: DC | PRN
  Administered 2013-06-02: 14 mL/h via EPIDURAL
  Filled 2013-06-02: qty 125

## 2013-06-02 MED ORDER — OXYCODONE-ACETAMINOPHEN 5-325 MG PO TABS: 1.0000 | ORAL_TABLET | ORAL | Status: DC | PRN

## 2013-06-02 MED ORDER — LACTATED RINGERS IV SOLN
500.0000 mL | Freq: Once | INTRAVENOUS | Status: AC
  Administered 2013-06-02: 500 mL via INTRAVENOUS

## 2013-06-02 MED ORDER — OXYTOCIN 40 UNITS IN LACTATED RINGERS INFUSION - SIMPLE MED
62.5000 mL/h | INTRAVENOUS | Status: DC
  Filled 2013-06-02: qty 1000

## 2013-06-02 MED ORDER — ONDANSETRON HCL 4 MG PO TABS: 4.0000 mg | ORAL_TABLET | ORAL | Status: DC | PRN

## 2013-06-02 MED ORDER — PHENYLEPHRINE 40 MCG/ML (10ML) SYRINGE FOR IV PUSH (FOR BLOOD PRESSURE SUPPORT)
80.0000 ug | PREFILLED_SYRINGE | INTRAVENOUS | Status: DC | PRN
  Filled 2013-06-02: qty 2

## 2013-06-02 MED ORDER — PENICILLIN G POTASSIUM 5000000 UNITS IJ SOLR
5.0000 10*6.[IU] | Freq: Once | INTRAVENOUS | Status: AC
  Administered 2013-06-02: 5 10*6.[IU] via INTRAVENOUS
  Filled 2013-06-02: qty 5

## 2013-06-02 MED ORDER — IBUPROFEN 600 MG PO TABS
600.0000 mg | ORAL_TABLET | Freq: Four times a day (QID) | ORAL | Status: DC
  Administered 2013-06-02 – 2013-06-03 (×4): 600 mg via ORAL
  Filled 2013-06-02 (×5): qty 1

## 2013-06-02 MED ORDER — EPHEDRINE 5 MG/ML INJ
10.0000 mg | INTRAVENOUS | Status: DC | PRN
  Filled 2013-06-02: qty 4
  Filled 2013-06-02: qty 2

## 2013-06-02 NOTE — Anesthesia Procedure Notes (Signed)
Epidural Patient location during procedure: OB Start time: 06/02/2013 6:44 PM  Staffing Anesthesiologist: Brayton Caves Performed by: anesthesiologist   Preanesthetic Checklist Completed: patient identified, site marked, surgical consent, pre-op evaluation, timeout performed, IV checked, risks and benefits discussed and monitors and equipment checked  Epidural Patient position: sitting Prep: site prepped and draped and DuraPrep Patient monitoring: continuous pulse ox and blood pressure Approach: midline Injection technique: LOR air  Needle:  Needle type: Tuohy  Needle gauge: 17 G Needle length: 9 cm and 9 Needle insertion depth: 5 cm cm Catheter type: closed end flexible Catheter size: 19 Gauge Catheter at skin depth: 10 cm Test dose: negative  Assessment Events: blood not aspirated, injection not painful, no injection resistance, negative IV test and no paresthesia  Additional Notes Patient identified.  Risk benefits discussed including failed block, incomplete pain control, headache, nerve damage, paralysis, blood pressure changes, nausea, vomiting, reactions to medication both toxic or allergic, and postpartum back pain.  Patient expressed understanding and wished to proceed.  All questions were answered.  Sterile technique used throughout procedure and epidural site dressed with sterile barrier dressing. No paresthesia or other complications noted.The patient did not experience any signs of intravascular injection such as tinnitus or metallic taste in mouth nor signs of intrathecal spread such as rapid motor block. Please see nursing notes for vital signs.

## 2013-06-02 NOTE — Progress Notes (Signed)
Per Dr. Tenny Craw pt allowed to walk with tele monitors on

## 2013-06-02 NOTE — Progress Notes (Signed)
Dr. Tenny Craw notified of pt continuing to refuse pitocin for augmentation--orders to notify MD if pt changes her mind

## 2013-06-02 NOTE — Anesthesia Preprocedure Evaluation (Signed)

## 2013-06-02 NOTE — Progress Notes (Signed)
  S: Comfortable O:  Filed Vitals:   06/02/13 0657 06/02/13 0805 06/02/13 0906 06/02/13 1010  BP: 101/56 114/55    Pulse: 79 98    Temp: 98.3 F (36.8 C)     TempSrc: Oral     Resp: 20 18 18 18   Height:      Weight:      SpO2:       FHT 140 reactive cvx 1/50/-2 per last cervix exam toco no contractions  A?P 1) Pt is refusing pitocin at this time. She is GBS + and has been on antibiotics. SROM @ 0300. Ok to wait until 3pm, but if labor does not ensue will need to have a more frank discussion about augmentation 2) FWB reassuring

## 2013-06-02 NOTE — H&P (Signed)
27 y.o. [redacted]w[redacted]d  [id number] comes in c/o SROM clear at 0245.  Otherwise has good fetal movement and no bleeding.  History reviewed. No pertinent past medical history.  Past Surgical History  Procedure Laterality Date  . No past surgeries      OB History  Gravida Para Term Preterm AB SAB TAB Ectopic Multiple Living  6 3 3  0 2 2 0 0 0 3    # Outcome Date GA Lbr Len/2nd Weight Sex Delivery Anes PTL Lv  6 CUR           5 TRM 04/11/11 [redacted]w[redacted]d 08:08 / 00:18 3.835 kg (8 lb 7.3 oz) F SVD EPI  Y     Comments: no probelms  4 TRM 11/2008 [redacted]w[redacted]d 04:00 3.204 kg (7 lb 1 oz) F SVD EPI N Y  3 TRM 05/2007 [redacted]w[redacted]d 10:00 2.835 kg (6 lb 4 oz) M SVD EPI N Y  2 SAB           1 SAB               History   Social History  . Marital Status: Married    Spouse Name: N/A    Number of Children: N/A  . Years of Education: N/A   Occupational History  . Not on file.   Social History Main Topics  . Smoking status: Never Smoker   . Smokeless tobacco: Never Used  . Alcohol Use: No  . Drug Use: No  . Sexual Activity: Yes   Other Topics Concern  . Not on file   Social History Narrative  . No narrative on file   Review of patient's allergies indicates no known allergies.    Prenatal Transfer Tool  Maternal Diabetes: No Genetic Screening: Declined Maternal Ultrasounds/Referrals: Normal Fetal Ultrasounds or other Referrals:  None Maternal Substance Abuse:  No Significant Maternal Medications:  None Significant Maternal Lab Results: None  Other PNC: uncomplicated.    Filed Vitals:   06/02/13 0531  BP: 106/67  Pulse: 93  Temp:   Resp: 20     Lungs/Cor:  NAD Abdomen:  soft, gravid Ex:  no cords, erythema SVE:  1/40/-2, SSE + pool and fern by NP FHTs:  130s, good STV, NST R Toco:  q5   A/P   Term SROM; latent labor.  GBS positive on UCX- colonized. Pt is G4P3.  PCN given; will wait for 4 hours before starting pitocin.  Epidural if goes into labor on own in meantime.   Ajai Harville A

## 2013-06-02 NOTE — MAU Note (Signed)
Gush of fluid over toilet at 245 am. States currently not leaking. No ctx. Positive fetal movement.

## 2013-06-03 LAB — CBC
HCT: 33.4 % — ABNORMAL LOW (ref 36.0–46.0)
Hemoglobin: 11.1 g/dL — ABNORMAL LOW (ref 12.0–15.0)
MCH: 28.6 pg (ref 26.0–34.0)
MCHC: 33.2 g/dL (ref 30.0–36.0)
MCV: 86.1 fL (ref 78.0–100.0)
Platelets: 125 10*3/uL — ABNORMAL LOW (ref 150–400)
RBC: 3.88 MIL/uL (ref 3.87–5.11)
RDW: 14.1 % (ref 11.5–15.5)
WBC: 11.7 10*3/uL — ABNORMAL HIGH (ref 4.0–10.5)

## 2013-06-03 NOTE — Progress Notes (Addendum)
Pt may desire d/c today.  Parents desire Bris at home.

## 2013-06-03 NOTE — Progress Notes (Signed)
Patient is eating, ambulating, voiding.  Pain control is good.  Filed Vitals:   06/02/13 2146 06/02/13 2238 06/02/13 2358 06/03/13 0400  BP: 99/57 96/57 95/60  93/57  Pulse: 68 61 83   Temp:  97.8 F (36.6 C) 98.1 F (36.7 C) 97.8 F (36.6 C)  TempSrc:  Oral Oral Oral  Resp: 18 18 18 18   Height:      Weight:      SpO2:        Fundus firm Perineum without swelling.  Lab Results  Component Value Date   WBC 11.7* 06/03/2013   HGB 11.1* 06/03/2013   HCT 33.4* 06/03/2013   MCV 86.1 06/03/2013   PLT 125* 06/03/2013     O+/RI  A/P Post partum day 1.  Routine care.  Expect d/c tomorrow.    Yoselin Amerman A

## 2013-06-03 NOTE — Lactation Note (Signed)
This note was copied from the chart of Monica Mathis. Lactation Consultation Note :Initial visit with mom She is experienced BF mom-nursed 3 previous children for 1 year each. Reports that baby is nursing well but her nipples are a little tender. Baby asleep in bassinet with other children visiting baby. Encouraged to call for assist when baby wakes for next feeding. BF brochure given with resources for support after DC. No questions at present.  Patient Name: Monica Mathis [id number] Date: 06/03/2013 Reason for consult: Initial assessment   Maternal Data Formula Feeding for Exclusion: No Infant to breast within first hour of birth: Yes Does the patient have breastfeeding experience prior to this delivery?: Yes  Feeding Feeding Type: Breast Fed  LATCH Score/Interventions                      Lactation Tools Discussed/Used     Consult Status Consult Status: Follow-up Date: 06/03/13 Follow-up type: In-patient    Pamelia Hoit 06/03/2013, 10:52 AM

## 2013-06-03 NOTE — Anesthesia Postprocedure Evaluation (Signed)
Anesthesia Post Note  Patient: Monica Mathis  Procedure(s) Performed: * No procedures listed *  Anesthesia type: Epidural  Patient location: Mother/Baby  Post pain: Pain level controlled  Post assessment: Post-op Vital signs reviewed  Last Vitals:  Filed Vitals:   06/03/13 0400  BP: 93/57  Pulse:   Temp: 36.6 C  Resp: 18    Post vital signs: Reviewed  Level of consciousness:alert  Complications: No apparent anesthesia complications

## 2013-06-03 NOTE — Discharge Summary (Signed)
Obstetric Discharge Summary Reason for Admission: onset of labor and rupture of membranes Prenatal Procedures: none Intrapartum Procedures: spontaneous vaginal delivery Postpartum Procedures: none Complications-Operative and Postpartum: none Hemoglobin  Date Value Range Status  06/03/2013 11.1* 12.0 - 15.0 g/dL Final     HCT  Date Value Range Status  06/03/2013 33.4* 36.0 - 46.0 % Final    Discharge Diagnoses: Term Pregnancy-delivered  Discharge Information: Date: 06/03/2013 Activity: pelvic rest Diet: routine Medications: Ibuprofen Condition: stable Instructions: refer to practice specific booklet Discharge to: home Follow-up Information   Follow up with [name], MD In 4 weeks.   Specialty:  Obstetrics and Gynecology   Contact information:   [address] [place] [address] [phone]       Newborn Data: Live born female  Birth Weight: 6 lb 14.4 oz (3130 g) APGAR: 9, 9  Home with mother.  Monica Mathis A 06/03/2013, 8:46 AM

## 2014-06-06 ENCOUNTER — Encounter (HOSPITAL_COMMUNITY): Payer: Self-pay

## 2014-08-05 NOTE — L&D Delivery Note (Signed)
Patient was C/C/+2 and pushed for 5 minutes with epidural.   NSVD  female infant, Apgars 9,10, weight P.   The patient had no lacerations. Fundus was firm. EBL was expected amount. Placenta was delivered intact. Vagina was clear.  Baby was vigorous and doing skin to skin with mother.  Parents will do circ in 8 days.    Monica Mathis

## 2014-08-18 LAB — OB RESULTS CONSOLE ABO/RH: RH Type: POSITIVE

## 2014-08-18 LAB — OB RESULTS CONSOLE RUBELLA ANTIBODY, IGM: Rubella: IMMUNE

## 2014-08-18 LAB — OB RESULTS CONSOLE GC/CHLAMYDIA
Chlamydia: NEGATIVE
Gonorrhea: NEGATIVE

## 2014-08-18 LAB — OB RESULTS CONSOLE ANTIBODY SCREEN: Antibody Screen: NEGATIVE

## 2014-08-18 LAB — OB RESULTS CONSOLE HIV ANTIBODY (ROUTINE TESTING): HIV: NONREACTIVE

## 2014-08-18 LAB — OB RESULTS CONSOLE RPR: RPR: NONREACTIVE

## 2014-08-18 LAB — OB RESULTS CONSOLE HEPATITIS B SURFACE ANTIGEN: Hepatitis B Surface Ag: NEGATIVE

## 2015-01-13 LAB — OB RESULTS CONSOLE GBS: GBS: POSITIVE

## 2015-02-21 ENCOUNTER — Inpatient Hospital Stay (HOSPITAL_COMMUNITY)
Admission: AD | Admit: 2015-02-21 | Discharge: 2015-02-23 | DRG: 775 | Disposition: A | Discharge status: Home / Self Care | Payer: Medicaid Other | Source: Ambulatory Visit | Attending: Obstetrics and Gynecology | Admitting: Obstetrics and Gynecology

## 2015-02-21 ENCOUNTER — Inpatient Hospital Stay (HOSPITAL_COMMUNITY): Payer: Medicaid Other | Admitting: Anesthesiology

## 2015-02-21 ENCOUNTER — Encounter (HOSPITAL_COMMUNITY): Payer: Self-pay | Admitting: *Deleted

## 2015-02-21 ENCOUNTER — Telehealth (HOSPITAL_COMMUNITY): Payer: Self-pay | Admitting: *Deleted

## 2015-02-21 DIAGNOSIS — O99824 Streptococcus B carrier state complicating childbirth: Secondary | ICD-10-CM | POA: Diagnosis present

## 2015-02-21 DIAGNOSIS — Z3A39 39 weeks gestation of pregnancy: Secondary | ICD-10-CM | POA: Diagnosis present

## 2015-02-21 DIAGNOSIS — IMO0001 Reserved for inherently not codable concepts without codable children: Secondary | ICD-10-CM

## 2015-02-21 DIAGNOSIS — O09523 Supervision of elderly multigravida, third trimester: Secondary | ICD-10-CM | POA: Diagnosis not present

## 2015-02-21 HISTORY — DX: Other specified health status: Z78.9

## 2015-02-21 LAB — CBC
HCT: 38.3 % (ref 36.0–46.0)
Hemoglobin: 12.7 g/dL (ref 12.0–15.0)
MCH: 29.1 pg (ref 26.0–34.0)
MCHC: 33.2 g/dL (ref 30.0–36.0)
MCV: 87.6 fL (ref 78.0–100.0)
Platelets: 136 10*3/uL — ABNORMAL LOW (ref 150–400)
RBC: 4.37 MIL/uL (ref 3.87–5.11)
RDW: 14.3 % (ref 11.5–15.5)
WBC: 10.8 10*3/uL — ABNORMAL HIGH (ref 4.0–10.5)

## 2015-02-21 LAB — TYPE AND SCREEN
ABO/RH(D): O POS
Antibody Screen: NEGATIVE

## 2015-02-21 MED ORDER — LIDOCAINE HCL (PF) 1 % IJ SOLN
30.0000 mL | INTRAMUSCULAR | Status: DC | PRN
  Filled 2015-02-21: qty 30

## 2015-02-21 MED ORDER — ACETAMINOPHEN 325 MG PO TABS: 650.0000 mg | ORAL_TABLET | ORAL | Status: DC | PRN

## 2015-02-21 MED ORDER — TETANUS-DIPHTH-ACELL PERTUSSIS 5-2.5-18.5 LF-MCG/0.5 IM SUSP: 0.5000 mL | Freq: Once | INTRAMUSCULAR | Status: DC

## 2015-02-21 MED ORDER — OXYCODONE-ACETAMINOPHEN 5-325 MG PO TABS: 2.0000 | ORAL_TABLET | ORAL | Status: DC | PRN

## 2015-02-21 MED ORDER — LACTATED RINGERS IV SOLN
INTRAVENOUS | Status: DC
  Administered 2015-02-21: 16:00:00 via INTRAVENOUS

## 2015-02-21 MED ORDER — FERROUS SULFATE 325 (65 FE) MG PO TABS
325.0000 mg | ORAL_TABLET | Freq: Two times a day (BID) | ORAL | Status: DC
  Administered 2015-02-22 (×2): 325 mg via ORAL
  Filled 2015-02-21 (×3): qty 1

## 2015-02-21 MED ORDER — DIPHENHYDRAMINE HCL 25 MG PO CAPS
25.0000 mg | ORAL_CAPSULE | Freq: Four times a day (QID) | ORAL | Status: DC | PRN
Start: 2015-02-21 — End: 2015-02-23

## 2015-02-21 MED ORDER — BENZOCAINE-MENTHOL 20-0.5 % EX AERO: 1.0000 "application " | INHALATION_SPRAY | CUTANEOUS | Status: DC | PRN

## 2015-02-21 MED ORDER — PENICILLIN G POTASSIUM 5000000 UNITS IJ SOLR
5.0000 10*6.[IU] | Freq: Once | INTRAVENOUS | Status: AC
  Administered 2015-02-21: 5 10*6.[IU] via INTRAVENOUS
  Filled 2015-02-21: qty 5

## 2015-02-21 MED ORDER — OXYCODONE-ACETAMINOPHEN 5-325 MG PO TABS
1.0000 | ORAL_TABLET | ORAL | Status: DC | PRN
  Filled 2015-02-21: qty 1

## 2015-02-21 MED ORDER — SIMETHICONE 80 MG PO CHEW: 80.0000 mg | CHEWABLE_TABLET | ORAL | Status: DC | PRN

## 2015-02-21 MED ORDER — PRENATAL MULTIVITAMIN CH
1.0000 | ORAL_TABLET | Freq: Every day | ORAL | Status: DC
  Administered 2015-02-22 – 2015-02-23 (×2): 1 via ORAL
  Filled 2015-02-21 (×2): qty 1

## 2015-02-21 MED ORDER — ZOLPIDEM TARTRATE 5 MG PO TABS: 5.0000 mg | ORAL_TABLET | Freq: Every evening | ORAL | Status: DC | PRN

## 2015-02-21 MED ORDER — ONDANSETRON HCL 4 MG/2ML IJ SOLN: 4.0000 mg | INTRAMUSCULAR | Status: DC | PRN

## 2015-02-21 MED ORDER — EPHEDRINE 5 MG/ML INJ
10.0000 mg | INTRAVENOUS | Status: DC | PRN
  Filled 2015-02-21: qty 2

## 2015-02-21 MED ORDER — ONDANSETRON HCL 4 MG PO TABS: 4.0000 mg | ORAL_TABLET | ORAL | Status: DC | PRN

## 2015-02-21 MED ORDER — OXYTOCIN 40 UNITS IN LACTATED RINGERS INFUSION - SIMPLE MED
62.5000 mL/h | INTRAVENOUS | Status: DC
  Filled 2015-02-21: qty 1000

## 2015-02-21 MED ORDER — DIPHENHYDRAMINE HCL 50 MG/ML IJ SOLN: 12.5000 mg | INTRAMUSCULAR | Status: DC | PRN

## 2015-02-21 MED ORDER — OXYTOCIN BOLUS FROM INFUSION: 500.0000 mL | INTRAVENOUS | Status: DC

## 2015-02-21 MED ORDER — ONDANSETRON HCL 4 MG/2ML IJ SOLN: 4.0000 mg | Freq: Four times a day (QID) | INTRAMUSCULAR | Status: DC | PRN

## 2015-02-21 MED ORDER — LANOLIN HYDROUS EX OINT: TOPICAL_OINTMENT | CUTANEOUS | Status: DC | PRN

## 2015-02-21 MED ORDER — OXYCODONE-ACETAMINOPHEN 5-325 MG PO TABS
1.0000 | ORAL_TABLET | ORAL | Status: DC | PRN
  Administered 2015-02-22: 1 via ORAL

## 2015-02-21 MED ORDER — SENNOSIDES-DOCUSATE SODIUM 8.6-50 MG PO TABS
2.0000 | ORAL_TABLET | ORAL | Status: DC
  Administered 2015-02-23: 2 via ORAL
  Filled 2015-02-21 (×2): qty 2

## 2015-02-21 MED ORDER — SODIUM CHLORIDE 0.9 % IJ SOLN: 3.0000 mL | INTRAMUSCULAR | Status: DC | PRN

## 2015-02-21 MED ORDER — WITCH HAZEL-GLYCERIN EX PADS: 1.0000 "application " | MEDICATED_PAD | CUTANEOUS | Status: DC | PRN

## 2015-02-21 MED ORDER — IBUPROFEN 800 MG PO TABS
800.0000 mg | ORAL_TABLET | Freq: Three times a day (TID) | ORAL | Status: DC
  Administered 2015-02-21 – 2015-02-23 (×6): 800 mg via ORAL
  Filled 2015-02-21 (×6): qty 1

## 2015-02-21 MED ORDER — SODIUM CHLORIDE 0.9 % IJ SOLN: 3.0000 mL | Freq: Two times a day (BID) | INTRAMUSCULAR | Status: DC

## 2015-02-21 MED ORDER — CITRIC ACID-SODIUM CITRATE 334-500 MG/5ML PO SOLN
30.0000 mL | ORAL | Status: DC | PRN
  Filled 2015-02-21: qty 15

## 2015-02-21 MED ORDER — LIDOCAINE HCL (PF) 1 % IJ SOLN
INTRAMUSCULAR | Status: DC | PRN
  Administered 2015-02-21: 8 mL

## 2015-02-21 MED ORDER — SODIUM CHLORIDE 0.9 % IV SOLN: 250.0000 mL | INTRAVENOUS | Status: DC | PRN

## 2015-02-21 MED ORDER — MEASLES, MUMPS & RUBELLA VAC ~~LOC~~ INJ: 0.5000 mL | INJECTION | Freq: Once | SUBCUTANEOUS | Status: DC

## 2015-02-21 MED ORDER — LACTATED RINGERS IV SOLN: 500.0000 mL | INTRAVENOUS | Status: DC | PRN

## 2015-02-21 MED ORDER — DIBUCAINE 1 % RE OINT: 1.0000 "application " | TOPICAL_OINTMENT | RECTAL | Status: DC | PRN

## 2015-02-21 MED ORDER — METHYLERGONOVINE MALEATE 0.2 MG PO TABS: 0.2000 mg | ORAL_TABLET | ORAL | Status: DC | PRN

## 2015-02-21 MED ORDER — METHYLERGONOVINE MALEATE 0.2 MG/ML IJ SOLN: 0.2000 mg | INTRAMUSCULAR | Status: DC | PRN

## 2015-02-21 MED ORDER — PENICILLIN G POTASSIUM 5000000 UNITS IJ SOLR
2.5000 10*6.[IU] | INTRAVENOUS | Status: DC
  Filled 2015-02-21 (×3): qty 2.5

## 2015-02-21 MED ORDER — FLEET ENEMA 7-19 GM/118ML RE ENEM: 1.0000 | ENEMA | RECTAL | Status: DC | PRN

## 2015-02-21 MED ORDER — MAGNESIUM HYDROXIDE 400 MG/5ML PO SUSP: 30.0000 mL | ORAL | Status: DC | PRN

## 2015-02-21 MED ORDER — FENTANYL 2.5 MCG/ML BUPIVACAINE 1/10 % EPIDURAL INFUSION (WH - ANES)
14.0000 mL/h | INTRAMUSCULAR | Status: DC | PRN
  Administered 2015-02-21 (×2): 14 mL/h via EPIDURAL
  Filled 2015-02-21: qty 125

## 2015-02-21 MED ORDER — PHENYLEPHRINE 40 MCG/ML (10ML) SYRINGE FOR IV PUSH (FOR BLOOD PRESSURE SUPPORT)
80.0000 ug | PREFILLED_SYRINGE | INTRAVENOUS | Status: DC | PRN
  Filled 2015-02-21: qty 20
  Filled 2015-02-21: qty 2

## 2015-02-21 NOTE — Anesthesia Preprocedure Evaluation (Signed)
Anesthesia Evaluation  Patient identified by MRN, date of birth, ID band Patient awake    Reviewed: Allergy & Precautions, H&P , Patient's Chart, lab work & pertinent test results  Airway Mallampati: II  TM Distance: >3 FB Neck ROM: full    Dental no notable dental hx.    Pulmonary neg pulmonary ROS,  breath sounds clear to auscultation  Pulmonary exam normal       Cardiovascular negative cardio ROS  Rhythm:regular Rate:Normal     Neuro/Psych negative neurological ROS  negative psych ROS   GI/Hepatic negative GI ROS, Neg liver ROS,   Endo/Other  negative endocrine ROS  Renal/GU negative Renal ROS     Musculoskeletal   Abdominal   Peds  Hematology negative hematology ROS (+)   Anesthesia Other Findings   Reproductive/Obstetrics (+) Pregnancy                             Anesthesia Physical  Anesthesia Plan  ASA: II  Anesthesia Plan: Epidural   Post-op Pain Management:    Induction:   Airway Management Planned:   Additional Equipment:   Intra-op Plan:   Post-operative Plan:   Informed Consent: I have reviewed the patients History and Physical, chart, labs and discussed the procedure including the risks, benefits and alternatives for the proposed anesthesia with the patient or authorized representative who has indicated his/her understanding and acceptance.     Plan Discussed with: Anesthesiologist  Anesthesia Plan Comments:         Anesthesia Quick Evaluation

## 2015-02-21 NOTE — Telephone Encounter (Signed)
Preadmission screen  

## 2015-02-21 NOTE — Anesthesia Procedure Notes (Signed)
Epidural Patient location during procedure: OB  Staffing Anesthesiologist: Bunyan Brier Performed by: anesthesiologist   Preanesthetic Checklist Completed: patient identified, site marked, surgical consent, pre-op evaluation, timeout performed, IV checked, risks and benefits discussed and monitors and equipment checked  Epidural Patient position: sitting Prep: site prepped and draped and DuraPrep Patient monitoring: continuous pulse ox and blood pressure Approach: midline Location: L2-L3 Injection technique: LOR saline  Needle:  Needle type: Tuohy  Needle gauge: 17 G Needle length: 9 cm and 9 Needle insertion depth: 5 cm cm Catheter type: closed end flexible Catheter size: 19 Gauge Catheter at skin depth: 9 cm Test dose: negative  Assessment Events: blood not aspirated, injection not painful, no injection resistance, negative IV test and no paresthesia  Additional Notes Patient identified. Risks/Benefits/Options discussed with patient including but not limited to bleeding, infection, nerve damage, paralysis, failed block, incomplete pain control, headache, blood pressure changes, nausea, vomiting, reactions to medication both or allergic, itching and postpartum back pain. Confirmed with bedside nurse the patient's most recent platelet count. Confirmed with patient that they are not currently taking any anticoagulation, have any bleeding history or any family history of bleeding disorders. Patient expressed understanding and wished to proceed. All questions were answered. Sterile technique was used throughout the entire procedure. Please see nursing notes for vital signs. Test dose was given through epidural catheter and negative prior to continuing to dose epidural or start infusion. Warning signs of high block given to the patient including shortness of breath, tingling/numbness in hands, complete motor block, or any concerning symptoms with instructions to call for help. Patient  was given instructions on fall risk and not to get out of bed. All questions and concerns addressed with instructions to call with any issues or inadequate analgesia.

## 2015-02-21 NOTE — MAU Note (Signed)
[facility] called [facility]U, pt to be admitted to room 166.

## 2015-02-21 NOTE — H&P (Signed)
29 y.o. 374w1d  [id number] comes in c/o labor.  Otherwise has good fetal movement and no bleeding.  Past Medical History  Diagnosis Date  . Medical history non-contributory     Past Surgical History  Procedure Laterality Date  . No past surgeries      OB History  Gravida Para Term Preterm AB SAB TAB Ectopic Multiple Living  7 4 4  0 2 2 0 0 0 4    # Outcome Date GA Lbr Len/2nd Weight Sex Delivery Anes PTL Lv  7 Current           6 Term 06/02/13 2015w3d 17:09 / 00:24 3.13 kg (6 lb 14.4 oz) M Vag-Spont EPI  Y  5 Term 04/11/11 274w1d 08:08 / 00:18 3.835 kg (8 lb 7.3 oz) F Vag-Spont EPI  Y     Comments: no probelms  4 Term 11/2008 3484w0d 04:00 3.204 kg (7 lb 1 oz) F Vag-Spont EPI N Y  3 Term 05/2007 284w0d 10:00 2.835 kg (6 lb 4 oz) M Vag-Spont EPI N Y  2 SAB           1 SAB               History   Social History  . Marital Status: Married    Spouse Name: N/A  . Number of Children: N/A  . Years of Education: N/A   Occupational History  . Not on file.   Social History Main Topics  . Smoking status: Never Smoker   . Smokeless tobacco: Never Used  . Alcohol Use: No  . Drug Use: No  . Sexual Activity: Yes   Other Topics Concern  . Not on file   Social History Narrative   Review of patient's allergies indicates no known allergies.    Prenatal Transfer Tool  Maternal Diabetes: No Genetic Screening: Declined Maternal Ultrasounds/Referrals: Normal Fetal Ultrasounds or other Referrals:  None Maternal Substance Abuse:  No Significant Maternal Medications:  None Significant Maternal Lab Results: None  Other PNC: uncomplicated.  Pt did travel to [place] at about 30 weeks- denies any illness during or after.    Filed Vitals:   02/21/15 1725  BP: 113/72  Pulse: 105  Temp:   Resp:      Lungs/Cor:  NAD Abdomen:  soft, gravid Ex:  no cords, erythema SVE:  7/C/0, AROM clear FHTs:  120, good STV, NST R Toco:  q 10   A/P   Term labor.   GBS pos- PCN.  Chizara Mena  A

## 2015-02-21 NOTE — MAU Note (Signed)
5th baby, pt was 6cm in the office,pt for direct admit. Wanting epid.  Explained bed situation to pt

## 2015-02-22 LAB — CBC
HCT: 34.5 % — ABNORMAL LOW (ref 36.0–46.0)
Hemoglobin: 11.1 g/dL — ABNORMAL LOW (ref 12.0–15.0)
MCH: 28.3 pg (ref 26.0–34.0)
MCHC: 32.2 g/dL (ref 30.0–36.0)
MCV: 88 fL (ref 78.0–100.0)
Platelets: 114 10*3/uL — ABNORMAL LOW (ref 150–400)
RBC: 3.92 MIL/uL (ref 3.87–5.11)
RDW: 14.3 % (ref 11.5–15.5)
WBC: 13.2 10*3/uL — ABNORMAL HIGH (ref 4.0–10.5)

## 2015-02-22 LAB — SYPHILIS: RPR W/REFLEX TO RPR TITER AND TREPONEMAL ANTIBODIES, TRADITIONAL SCREENING AND DIAGNOSIS ALGORITHM: RPR Ser Ql: NONREACTIVE

## 2015-02-22 NOTE — Progress Notes (Signed)
Post Partum Day 1 Subjective: no complaints, up ad lib, voiding, tolerating PO, + flatus and breast feeding. Mother and baby are bonding well  Objective: Blood pressure 89/46, pulse 51, temperature 97.6 F (36.4 C), temperature source Axillary, resp. rate 16, height 5\' 3"  (1.6 m), weight 68.04 kg (150 lb), last menstrual period 05/23/2014, SpO2 100 %, unknown if currently breastfeeding.  Physical Exam:  General: alert, cooperative and no distress Lochia: appropriate Uterine Fundus: firm perineum: healing well, no significant drainage, no dehiscence, no significant erythema, no heavy bleeding DVT Evaluation: No evidence of DVT seen on physical exam. Negative Homan's sign. No cords or calf tenderness.   Recent Labs  02/21/15 1526 02/22/15 0550  HGB 12.7 11.1*  HCT 38.3 34.5*    Assessment/Plan: Plan for discharge tomorrow, Breastfeeding and Circumcision - brisk   LOS: 1 day   Rachyl Wuebker STACIA 02/22/2015, 8:45 AM

## 2015-02-22 NOTE — Lactation Note (Signed)
This note was copied from the chart of Monica Mathis Allsup. Lactation Consultation Note  Patient Name: Monica Mathis Nylen BMWUX'LToday's Date: 02/22/2015 Reason for consult: Initial assessment  Baby is 20 hours old and has been to the breast, except per mom has been sleepy during the day today. Wet and stool changed before latch , Latched with depth and multiply swallows , lips flanged. Baby fed for 20 mins. Mom plans to offer 2nd breast.  Per mom comfortable with latch except cramping. LC reviewed basics. Mother informed of post-discharge support and given phone number to the lactation department, including services  for phone call assistance; out-patient appointments; and breastfeeding support group. List of other breastfeeding resources in the community given in the handout. Encouraged mother to call for problems or concerns related to breastfeeding.   Maternal Data Has patient been taught Hand Expression?: Yes Does the patient have breastfeeding experience prior to this delivery?: Yes  Feeding Feeding Type: Breast Fed  LATCH Score/Interventions Latch: Grasps breast easily, tongue down, lips flanged, rhythmical sucking.  Audible Swallowing: Spontaneous and intermittent  Type of Nipple: Everted at rest and after stimulation  Comfort (Breast/Nipple): Soft / non-tender     Hold (Positioning): No assistance needed to correctly position infant at breast.  LATCH Score: 10  Lactation Tools Discussed/Used WIC Program: No   Consult Status Consult Status: Follow-up Date: 02/23/15 Follow-up type: In-patient    Kathrin Greathouseorio, Danea Manter Ann 02/22/2015, 3:02 PM

## 2015-02-22 NOTE — Progress Notes (Signed)
Patient had been up to bathroom, hadn't gone to bathroom for several hours.  Passed a plum size clot in pad.  Bleeding small in toilet.  Patient back to bed an checked, fundus firm , midline.

## 2015-02-22 NOTE — Anesthesia Postprocedure Evaluation (Signed)
Anesthesia Post Note  Patient: Monica Mathis  Procedure(s) Performed: * No procedures listed *  Anesthesia type: Epidural  Patient location: Mother/Baby  Post pain: Pain level controlled  Post assessment: Post-op Vital signs reviewed  Last Vitals:  Filed Vitals:   02/22/15 1143  BP: 108/62  Pulse: 82  Temp:   Resp: 20    Post vital signs: Reviewed  Level of consciousness:alert  Complications: No apparent anesthesia complications

## 2015-02-23 ENCOUNTER — Inpatient Hospital Stay (HOSPITAL_COMMUNITY): Admission: RE | Admit: 2015-02-23 | Payer: Medicaid Other | Source: Ambulatory Visit

## 2015-02-23 NOTE — Discharge Summary (Signed)
Obstetric Discharge Summary Reason for Admission: onset of labor Prenatal Procedures: ultrasound Intrapartum Procedures: spontaneous vaginal delivery Postpartum Procedures: none Complications-Operative and Postpartum: none HEMOGLOBIN  Date Value Ref Range Status  02/22/2015 11.1* 12.0 - 15.0 g/dL Final   HCT  Date Value Ref Range Status  02/22/2015 34.5* 36.0 - 46.0 % Final    Physical Exam:  General: alert Lochia: appropriate Uterine Fundus: firm   Discharge Diagnoses: Term Pregnancy-delivered  Discharge Information: Date: 02/23/2015 Activity: pelvic rest Diet: routine Medications: PNV and Ibuprofen Condition: stable Instructions: refer to practice specific booklet Discharge to: home Follow-up Information    Follow up with HORVATH,MICHELLE A, MD.   Specialty:  Obstetrics and Gynecology   Contact information:   [address] [place] [place] [address] [phone]       Newborn Data: Live born female  Birth Weight: 7 lb 7.8 oz (3396 g) APGAR: 9, 10  Home with mother.  Fredick Schlosser E 02/23/2015, 9:06 AM

## 2015-02-23 NOTE — Progress Notes (Signed)
PPD#2 Pt doing well Will discharge. 

## 2015-02-23 NOTE — Lactation Note (Signed)
This note was copied from the chart of Boy Adalyne Lovick. Lactation Consultation Note  Patient Name: Boy Esti Demello ONGEX'B Date: 02/23/2015 Reason for consult: Follow-up assessment Baby 40 hours old. Mom is an experienced BF, and states that she usually has some nipple soreness when her babies first start nursing. Noticed a small blister on baby's upper lip. Mom latched baby in cradle position to left breast and baby kept mouth tight and barely open when latching. Demonstrated how to flange baby's lower lip and mom reported increased comfort. After baby suckled for a few minutes, mom had a letdown of milk and baby's mouth relaxed, causing mom increased comfort. Baby maintained a deep latch, suckling rhythmically with intermittent swallows noted. Mom aware of OP/BFSG and LC phone line assistance after D/C.  Maternal Data    Feeding Feeding Type: Breast Fed Length of feed: 15 min  LATCH Score/Interventions Latch: Grasps breast easily, tongue down, lips flanged, rhythmical sucking.  Audible Swallowing: Spontaneous and intermittent  Type of Nipple: Everted at rest and after stimulation  Comfort (Breast/Nipple): Filling, red/small blisters or bruises, mild/mod discomfort  Problem noted: Mild/Moderate discomfort  Hold (Positioning): No assistance needed to correctly position infant at breast.  LATCH Score: 9  Lactation Tools Discussed/Used     Consult Status Consult Status: Complete    Geralynn Ochs 02/23/2015, 10:42 AM

## 2016-02-29 ENCOUNTER — Other Ambulatory Visit: Payer: Self-pay | Admitting: Obstetrics and Gynecology

## 2016-03-05 LAB — OB RESULTS CONSOLE GC/CHLAMYDIA
Chlamydia: NEGATIVE
Gonorrhea: NEGATIVE

## 2016-03-05 LAB — OB RESULTS CONSOLE GBS: GBS: POSITIVE

## 2016-06-18 LAB — OB RESULTS CONSOLE ANTIBODY SCREEN: Antibody Screen: NEGATIVE

## 2016-06-18 LAB — OB RESULTS CONSOLE ABO/RH: RH Type: POSITIVE

## 2016-06-18 LAB — OB RESULTS CONSOLE RPR: RPR: NONREACTIVE

## 2016-06-18 LAB — OB RESULTS CONSOLE HIV ANTIBODY (ROUTINE TESTING): HIV: NONREACTIVE

## 2016-06-18 LAB — OB RESULTS CONSOLE RUBELLA ANTIBODY, IGM: Rubella: NON-IMMUNE/NOT IMMUNE

## 2016-06-18 LAB — OB RESULTS CONSOLE HEPATITIS B SURFACE ANTIGEN: Hepatitis B Surface Ag: NEGATIVE

## 2016-07-26 ENCOUNTER — Inpatient Hospital Stay (HOSPITAL_COMMUNITY)
Admission: AD | Admit: 2016-07-26 | Discharge: 2016-07-26 | Disposition: A | Discharge status: Home / Self Care | Payer: BLUE CROSS/BLUE SHIELD | Source: Ambulatory Visit | Attending: Obstetrics and Gynecology | Admitting: Obstetrics and Gynecology

## 2016-07-26 ENCOUNTER — Encounter (HOSPITAL_COMMUNITY): Payer: Self-pay

## 2016-07-26 DIAGNOSIS — Z3689 Encounter for other specified antenatal screening: Secondary | ICD-10-CM

## 2016-07-26 DIAGNOSIS — O368130 Decreased fetal movements, third trimester, not applicable or unspecified: Secondary | ICD-10-CM | POA: Insufficient documentation

## 2016-07-26 DIAGNOSIS — Z3A32 32 weeks gestation of pregnancy: Secondary | ICD-10-CM | POA: Insufficient documentation

## 2016-07-26 NOTE — MAU Note (Signed)
Pt presents complaining of decreased fetal movement all day yesterday. Denies pain, vaginal bleeding or leaking of fluid.

## 2016-07-26 NOTE — Discharge Instructions (Signed)
Introduction Patient Name: ________________________________________________ Patient Due Date: ____________________ What is a fetal movement count? A fetal movement count is the number of times that you feel your baby move during a certain amount of time. This may also be called a fetal kick count. A fetal movement count is recommended for every pregnant woman. You may be asked to start counting fetal movements as early as week 28 of your pregnancy. Pay attention to when your baby is most active. You may notice your baby's sleep and wake cycles. You may also notice things that make your baby move more. You should do a fetal movement count:  When your baby is normally most active.  At the same time each day. A good time to count movements is while you are resting, after having something to eat and drink. How do I count fetal movements? 1. Find a quiet, comfortable area. Sit, or lie down on your side. 2. Write down the date, the start time and stop time, and the number of movements that you felt between those two times. Take this information with you to your health care visits. 3. For 2 hours, count kicks, flutters, swishes, rolls, and jabs. You should feel at least 10 movements during 2 hours. 4. You may stop counting after you have felt 10 movements. 5. If you do not feel 10 movements in 2 hours, have something to eat and drink. Then, keep resting and counting for 1 hour. If you feel at least 4 movements during that hour, you may stop counting. Contact a health care provider if:  You feel fewer than 4 movements in 2 hours.  Your baby is not moving like he or she usually does. Date: ____________ Start time: ____________ Stop time: ____________ Movements: ____________ Date: ____________ Start time: ____________ Stop time: ____________ Movements: ____________ Date: ____________ Start time: ____________ Stop time: ____________ Movements: ____________ Date: ____________ Start time: ____________  Stop time: ____________ Movements: ____________ Date: ____________ Start time: ____________ Stop time: ____________ Movements: ____________ Date: ____________ Start time: ____________ Stop time: ____________ Movements: ____________ Date: ____________ Start time: ____________ Stop time: ____________ Movements: ____________ Date: ____________ Start time: ____________ Stop time: ____________ Movements: ____________ Date: ____________ Start time: ____________ Stop time: ____________ Movements: ____________ This information is not intended to replace advice given to you by your health care provider. Make sure you discuss any questions you have with your health care provider. Document Released: 08/21/2006 Document Revised: 03/20/2016 Document Reviewed: 08/31/2015 Elsevier Interactive Patient Education  2017 Elsevier Inc.  

## 2016-07-26 NOTE — MAU Provider Note (Signed)
  History     CSN: [id number]  Arrival date and time: 07/26/16 0443   First Provider Initiated Contact with Patient 07/26/16 [phone]      Chief Complaint  Patient presents with  . Decreased Fetal Movement   Monica Mathis is a 30 y.o. [id number] at 6049w1d who presents today with decreased fetal movement. She states that she was very busy at home with her 5 children, and she felt like she did not feel any movement yesterday. Then she went to bed, and when she woke she realized that she still had not felt the baby move. She states that since being here she is feeling normal fetal movement. She denies any abdominal pain, vaginal bleeding or leaking of fluid. She has an appointment later today in the office.    Past Medical History:  Diagnosis Date  . Medical history non-contributory     Past Surgical History:  Procedure Laterality Date  . NO PAST SURGERIES      History reviewed. No pertinent family history.  Social History  Substance Use Topics  . Smoking status: Never Smoker  . Smokeless tobacco: Never Used  . Alcohol use No    Allergies: No Known Allergies  Prescriptions Prior to Admission  Medication Sig Dispense Refill Last Dose  . Prenatal Vit-Fe Fumarate-FA (PRENATAL MULTIVITAMIN) TABS tablet Take 1 tablet by mouth at bedtime.   02/20/2015 at Unknown time    Review of Systems  Constitutional: Negative for chills and fever.  Gastrointestinal: Negative for abdominal pain, constipation, diarrhea, nausea and vomiting.  Genitourinary: Negative for dysuria, frequency and urgency.   Physical Exam   Blood pressure 122/85, pulse 88, temperature 98 F (36.7 C), temperature source Oral, resp. rate 18, unknown if currently breastfeeding.  Physical Exam  Nursing note and vitals reviewed. Constitutional: She appears well-developed and well-nourished. No distress.  HENT:  Head: Normocephalic.  Cardiovascular: Normal rate.   Respiratory: Effort normal.  GI: Soft. There is no  tenderness. There is no rebound.  Musculoskeletal: Normal range of motion.  Neurological: She is alert.  Skin: Skin is warm and dry.  Psychiatric: She has a normal mood and affect.   FHT: 145, moderate with 15x15 accels, no decels Toco: no UCs  MAU Course  Procedures  MDM 0522: D/W Dr. Tenny Crawoss, ok for DC home.   Assessment and Plan   1. Decreased fetal movements in third trimester, single or unspecified fetus   2. NST (non-stress test) reactive    DC home Comfort measures reviewed  3rd Trimester precautions  PTL precautions  Fetal kick counts RX: none  Return to MAU as needed FU with OB as planned  Follow-up Information    HORVATH,MICHELLE A, MD Follow up.   Specialty:  Obstetrics and Gynecology Contact information: [address] [place] [address] [phone]            Tawnya CrookHogan, Heather Donovan 07/26/2016, 5:16 AM

## 2016-08-05 NOTE — L&D Delivery Note (Signed)
Delivery Note At 5:47 PM a viable female was delivered via Vaginal, Spontaneous Delivery (Presentation vertex: ; LOA ).  APGAR: 8, 9; weight  .   Placenta status spont: ,shultz .  Cord:3 vc  with the following complications: none.  Cord pH: n/a  Anesthesia:  none Episiotomy: None Lacerations: None Suture Repair: n/a Est. Blood Loss (mL): 100  Mom to postpartum.  Baby to Couplet care / Skin to Skin.  Wyvonnia DuskyMarie Nicolae Vasek 09/08/2016, 6:06 PM

## 2016-09-08 ENCOUNTER — Inpatient Hospital Stay (HOSPITAL_COMMUNITY)
Admission: AD | Admit: 2016-09-08 | Discharge: 2016-09-10 | DRG: 775 | Disposition: A | Discharge status: Home / Self Care | Payer: BLUE CROSS/BLUE SHIELD | Source: Ambulatory Visit | Attending: Obstetrics and Gynecology | Admitting: Obstetrics and Gynecology

## 2016-09-08 ENCOUNTER — Encounter (HOSPITAL_COMMUNITY): Payer: Self-pay | Admitting: *Deleted

## 2016-09-08 DIAGNOSIS — Z3493 Encounter for supervision of normal pregnancy, unspecified, third trimester: Secondary | ICD-10-CM | POA: Diagnosis present

## 2016-09-08 DIAGNOSIS — Z3A38 38 weeks gestation of pregnancy: Secondary | ICD-10-CM

## 2016-09-08 DIAGNOSIS — O99824 Streptococcus B carrier state complicating childbirth: Secondary | ICD-10-CM | POA: Diagnosis present

## 2016-09-08 DIAGNOSIS — O4292 Full-term premature rupture of membranes, unspecified as to length of time between rupture and onset of labor: Secondary | ICD-10-CM | POA: Diagnosis present

## 2016-09-08 LAB — CBC
HCT: 35.5 % — ABNORMAL LOW (ref 36.0–46.0)
Hemoglobin: 12 g/dL (ref 12.0–15.0)
MCH: 29.1 pg (ref 26.0–34.0)
MCHC: 33.8 g/dL (ref 30.0–36.0)
MCV: 86 fL (ref 78.0–100.0)
Platelets: 142 10*3/uL — ABNORMAL LOW (ref 150–400)
RBC: 4.13 MIL/uL (ref 3.87–5.11)
RDW: 14.1 % (ref 11.5–15.5)
WBC: 10.1 10*3/uL (ref 4.0–10.5)

## 2016-09-08 LAB — TYPE AND SCREEN
ABO/RH(D): O POS
Antibody Screen: NEGATIVE

## 2016-09-08 LAB — AMNISURE RUPTURE OF MEMBRANE (ROM) NOT AT ARMC: Amnisure ROM: POSITIVE

## 2016-09-08 LAB — RPR: RPR Ser Ql: NONREACTIVE

## 2016-09-08 MED ORDER — ONDANSETRON HCL 4 MG/2ML IJ SOLN: 4.0000 mg | Freq: Four times a day (QID) | INTRAMUSCULAR | Status: DC | PRN

## 2016-09-08 MED ORDER — OXYCODONE-ACETAMINOPHEN 5-325 MG PO TABS: 2.0000 | ORAL_TABLET | ORAL | Status: DC | PRN

## 2016-09-08 MED ORDER — OXYTOCIN BOLUS FROM INFUSION
500.0000 mL | Freq: Once | INTRAVENOUS | Status: AC
  Administered 2016-09-08: 500 mL via INTRAVENOUS

## 2016-09-08 MED ORDER — SIMETHICONE 80 MG PO CHEW: 80.0000 mg | CHEWABLE_TABLET | ORAL | Status: DC | PRN

## 2016-09-08 MED ORDER — LACTATED RINGERS IV SOLN
INTRAVENOUS | Status: DC
  Administered 2016-09-08: 05:00:00 via INTRAVENOUS

## 2016-09-08 MED ORDER — ACETAMINOPHEN 325 MG PO TABS
650.0000 mg | ORAL_TABLET | ORAL | Status: DC | PRN
  Filled 2016-09-08: qty 2

## 2016-09-08 MED ORDER — OXYCODONE-ACETAMINOPHEN 5-325 MG PO TABS: 1.0000 | ORAL_TABLET | ORAL | Status: DC | PRN

## 2016-09-08 MED ORDER — LIDOCAINE HCL (PF) 1 % IJ SOLN
30.0000 mL | INTRAMUSCULAR | Status: DC | PRN
  Filled 2016-09-08: qty 30

## 2016-09-08 MED ORDER — PENICILLIN G POTASSIUM 5000000 UNITS IJ SOLR
5.0000 10*6.[IU] | Freq: Once | INTRAVENOUS | Status: AC
  Administered 2016-09-08: 5 10*6.[IU] via INTRAVENOUS
  Filled 2016-09-08: qty 5

## 2016-09-08 MED ORDER — SENNOSIDES-DOCUSATE SODIUM 8.6-50 MG PO TABS
2.0000 | ORAL_TABLET | ORAL | Status: DC
  Administered 2016-09-09: 2 via ORAL
  Filled 2016-09-08: qty 2

## 2016-09-08 MED ORDER — COCONUT OIL OIL
1.0000 "application " | TOPICAL_OIL | Status: DC | PRN
  Filled 2016-09-08: qty 120

## 2016-09-08 MED ORDER — FLEET ENEMA 7-19 GM/118ML RE ENEM: 1.0000 | ENEMA | RECTAL | Status: DC | PRN

## 2016-09-08 MED ORDER — OXYTOCIN 40 UNITS IN LACTATED RINGERS INFUSION - SIMPLE MED: 2.5000 [IU]/h | INTRAVENOUS | Status: DC

## 2016-09-08 MED ORDER — DIBUCAINE 1 % RE OINT: 1.0000 "application " | TOPICAL_OINTMENT | RECTAL | Status: DC | PRN

## 2016-09-08 MED ORDER — WITCH HAZEL-GLYCERIN EX PADS: 1.0000 "application " | MEDICATED_PAD | CUTANEOUS | Status: DC | PRN

## 2016-09-08 MED ORDER — PENICILLIN G POT IN DEXTROSE 60000 UNIT/ML IV SOLN
3.0000 10*6.[IU] | INTRAVENOUS | Status: DC
  Administered 2016-09-08 (×2): 3 10*6.[IU] via INTRAVENOUS
  Filled 2016-09-08 (×5): qty 50

## 2016-09-08 MED ORDER — LACTATED RINGERS IV SOLN: 500.0000 mL | INTRAVENOUS | Status: DC | PRN

## 2016-09-08 MED ORDER — ONDANSETRON HCL 4 MG/2ML IJ SOLN: 4.0000 mg | INTRAMUSCULAR | Status: DC | PRN

## 2016-09-08 MED ORDER — ONDANSETRON HCL 4 MG PO TABS: 4.0000 mg | ORAL_TABLET | ORAL | Status: DC | PRN

## 2016-09-08 MED ORDER — SOD CITRATE-CITRIC ACID 500-334 MG/5ML PO SOLN: 30.0000 mL | ORAL | Status: DC | PRN

## 2016-09-08 MED ORDER — ZOLPIDEM TARTRATE 5 MG PO TABS: 5.0000 mg | ORAL_TABLET | Freq: Every evening | ORAL | Status: DC | PRN

## 2016-09-08 MED ORDER — TETANUS-DIPHTH-ACELL PERTUSSIS 5-2.5-18.5 LF-MCG/0.5 IM SUSP: 0.5000 mL | Freq: Once | INTRAMUSCULAR | Status: DC

## 2016-09-08 MED ORDER — OXYTOCIN 40 UNITS IN LACTATED RINGERS INFUSION - SIMPLE MED
1.0000 m[IU]/min | INTRAVENOUS | Status: DC
  Administered 2016-09-08: 2 m[IU]/min via INTRAVENOUS
  Filled 2016-09-08: qty 1000

## 2016-09-08 MED ORDER — IBUPROFEN 600 MG PO TABS
600.0000 mg | ORAL_TABLET | Freq: Four times a day (QID) | ORAL | Status: DC
  Administered 2016-09-08 – 2016-09-10 (×6): 600 mg via ORAL
  Filled 2016-09-08 (×6): qty 1

## 2016-09-08 MED ORDER — DIPHENHYDRAMINE HCL 25 MG PO CAPS: 25.0000 mg | ORAL_CAPSULE | Freq: Four times a day (QID) | ORAL | Status: DC | PRN

## 2016-09-08 MED ORDER — BENZOCAINE-MENTHOL 20-0.5 % EX AERO: 1.0000 "application " | INHALATION_SPRAY | CUTANEOUS | Status: DC | PRN

## 2016-09-08 MED ORDER — PRENATAL MULTIVITAMIN CH
1.0000 | ORAL_TABLET | Freq: Every day | ORAL | Status: DC
  Administered 2016-09-09: 1 via ORAL
  Filled 2016-09-08: qty 1

## 2016-09-08 MED ORDER — ACETAMINOPHEN 325 MG PO TABS: 650.0000 mg | ORAL_TABLET | ORAL | Status: DC | PRN

## 2016-09-08 NOTE — Progress Notes (Signed)
Viable baby boy delivered

## 2016-09-08 NOTE — Progress Notes (Signed)
Zerita Boersarlene Lawson CNM at bedside

## 2016-09-08 NOTE — Progress Notes (Signed)
Pt up to BR at 0334

## 2016-09-08 NOTE — Progress Notes (Signed)
Up to BR.

## 2016-09-08 NOTE — Anesthesia Pain Management Evaluation Note (Signed)
  CRNA Pain Management Visit Note  Patient: Monica Mathis, 31 y.o., female  "Hello I am a member of the anesthesia team at [facility]. We have an anesthesia team available at all times to provide care throughout the Math[facility], including epidural management and anesthesia for C-section. I don't know your plan for the delivery whether it a natural birth, water birth, IV sedation, nitrous supplementation, doula or epidural, but we want to meet your pain goals."   1.Was your pain managed to your expectations on prior hospitalizations?   No   2.What is your expectation for pain management during this hospitalization?     Labor support without medications  3.How can we help you reach that goal? Patient does not desire pain control interventions at this time.  Patient states that she has had epidurals in the past that either have not provided pain relief, or have not been given time to work d/t quick deliveries.  Record the patient's initial score and the patient's pain goal.   Pain: 0  Pain Goal: 6 [facility] wants you to be able to say your pain was always managed very well.  Monica Mathis 09/08/2016

## 2016-09-08 NOTE — H&P (Signed)
31 y.o. 7669w3d  [id number] comes in c/o LOF approx 1am.  Otherwise has good fetal movement and no bleeding.  Past Medical History:  Diagnosis Date  . Medical history non-contributory     Past Surgical History:  Procedure Laterality Date  . NO PAST SURGERIES      OB History  Gravida Para Term Preterm AB Living  8 5 5  0 2 5  SAB TAB Ectopic Multiple Live Births  2 0 0 0 5    # Outcome Date GA Lbr Len/2nd Weight Sex Delivery Anes PTL Lv  8 Current           7 Term 02/21/15 5995w1d 32:45 / 00:23 3.396 kg (7 lb 7.8 oz) M Vag-Spont EPI  LIV  6 Term 06/02/13 6069w3d 17:09 / 00:24 3.13 kg (6 lb 14.4 oz) M Vag-Spont EPI  LIV  5 Term 04/11/11 5295w1d 08:08 / 00:18 3.835 kg (8 lb 7.3 oz) F Vag-Spont EPI  LIV     Birth Comments: no probelms  4 Term 11/2008 6271w0d 04:00 3.204 kg (7 lb 1 oz) F Vag-Spont EPI N LIV  3 Term 05/2007 7971w0d 10:00 2.835 kg (6 lb 4 oz) M Vag-Spont EPI N LIV  2 SAB           1 SAB               Social History   Social History  . Marital status: Married    Spouse name: N/A  . Number of children: N/A  . Years of education: N/A   Occupational History  . Not on file.   Social History Main Topics  . Smoking status: Never Smoker  . Smokeless tobacco: Never Used  . Alcohol use No  . Drug use: No  . Sexual activity: Yes   Other Topics Concern  . Not on file   Social History Narrative  . No narrative on file   Patient has no known allergies.    Prenatal Transfer Tool  Maternal Diabetes: No Genetic Screening: Declined Maternal Ultrasounds/Referrals: Declined Fetal Ultrasounds or other Referrals:  None Maternal Substance Abuse:  No Significant Maternal Medications:  None Significant Maternal Lab Results: Lab values include: Group B Strep positive  Other PNC: uncomplicated.    Vitals:   09/08/16 0654 09/08/16 0729  BP: 125/66 (!) 97/51  Pulse: 96 95  Resp: 18 16  Temp: 98.4 F (36.9 C) 98.5 F (36.9 C)     Lungs/Cor:  NAD Abdomen:  soft, gravid Ex:   no cords, erythema SVE:  3.5/70/-3 FHTs:  140, good STV, NST R Toco:  irreg  A/P   Admit for SROM, term pregnancy 40.2, hospital date not accurate  GBS Pos - PCN  After 4 hrs will recheck and add pitocin prn  Other routine care  HanaALLAHAN, Luther ParodySIDNEY

## 2016-09-08 NOTE — MAU Note (Signed)
About an hour ago I woke up and was wet in the bed. Went to BR saw mucous when I wiped. Wore pad in but unsure if much fld on it. No pain. Fld was white with alitte pink

## 2016-09-08 NOTE — Progress Notes (Signed)
Dr Claiborne Billingsallahan aware of pt's admission and status. Aware of ctx pattern, sve, srom at 0130, reactive FHR with variables. [facility]

## 2016-09-09 LAB — CBC
HCT: 33.4 % — ABNORMAL LOW (ref 36.0–46.0)
Hemoglobin: 11.2 g/dL — ABNORMAL LOW (ref 12.0–15.0)
MCH: 29.1 pg (ref 26.0–34.0)
MCHC: 33.5 g/dL (ref 30.0–36.0)
MCV: 86.8 fL (ref 78.0–100.0)
Platelets: 145 10*3/uL — ABNORMAL LOW (ref 150–400)
RBC: 3.85 MIL/uL — ABNORMAL LOW (ref 3.87–5.11)
RDW: 14.3 % (ref 11.5–15.5)
WBC: 13.2 10*3/uL — ABNORMAL HIGH (ref 4.0–10.5)

## 2016-09-09 MED ORDER — MEASLES, MUMPS & RUBELLA VAC ~~LOC~~ INJ
0.5000 mL | INJECTION | Freq: Once | SUBCUTANEOUS | Status: AC
  Administered 2016-09-10: 0.5 mL via SUBCUTANEOUS
  Filled 2016-09-09: qty 0.5

## 2016-09-09 NOTE — Progress Notes (Signed)
Patient is doing well.  She is ambulating, voiding, tolerating PO.  Pain control is good.  Lochia is appropriate  Vitals:   09/08/16 2010 09/08/16 2110 09/09/16 0046 09/09/16 0930  BP: (!) 105/56 104/60 (!) 94/41 (!) 102/50  Pulse: 64 88 (!) 59 (!) 57  Resp: 18 18 18 18   Temp: 98.1 F (36.7 C) 97.9 F (36.6 C) 98.1 F (36.7 C) 97.7 F (36.5 C)  TempSrc: Oral Oral Oral Oral  SpO2: 98% 99% 98% 100%  Weight:      Height:        NAD Fundus firm Ext: no edema  Lab Results  Component Value Date   WBC 13.2 (H) 09/09/2016   HGB 11.2 (L) 09/09/2016   HCT 33.4 (L) 09/09/2016   MCV 86.8 09/09/2016   PLT 145 (L) 09/09/2016    --/--/O POS (02/04 0430)/RNonimmune  A/P 31 y.o. [id number] PPD#1. Routine care.   Expect d/c tomorrow. Female infant--planning bris   RNI--MMR prior to d/c  [place]

## 2016-09-09 NOTE — Lactation Note (Signed)
This note was copied from a baby's chart. Lactation Consultation Note Mom's 6th child. LC entered rm. Introduced self. Mom never spoke looked at me then back to her phone. Asked mom how BF was going. Mom looked at me then back down to her phone. I asked mom "do you speak English"? Mom looked at me annoyed and said yes. Asked mom how has BF going with this baby? Is the baby latching well? Mom stated yes.  BF them for approx. 12 months each. Asked mom if she ever had any trouble or infections, she replied "sometimes". I asked mom how long did she BF them. She stated almost 12 months. LC asked mom how old is the youngest child, mom stated 2.  Mom doesn't make eye contact, flat, annoyed look. LC discussed briefly to BF baby on cueing and at least 8-12 times a day. Cluster feeding starts around 24 hrs. Old to induce milk supply. Encouraged STS and I&O. WH/LC brochure given w/resources, support groups and LC services. Encouraged mom to call for assistance if needed.  Spoke w/RN. RN stated mom isn't very communicable .  Patient Name: Monica Nicholaus CorollaHinda Warwick WUJWJ'XToday's Date: 09/09/2016 Reason for consult: Initial assessment   Maternal Data Has patient been taught Hand Expression?: Yes Does the patient have breastfeeding experience prior to this delivery?: Yes  Feeding Feeding Type: Breast Fed Length of feed: 10 min  LATCH Score/Interventions                      Lactation Tools Discussed/Used     Consult Status Consult Status: PRN Date: 09/09/16 Follow-up type: In-patient    [name], Diamond NickelLAURA G 09/09/2016, 6:06 AM

## 2016-09-10 MED ORDER — IBUPROFEN 600 MG PO TABS: 600.0000 mg | ORAL_TABLET | Freq: Four times a day (QID) | ORAL | 0 refills | Status: DC

## 2016-09-10 NOTE — Lactation Note (Signed)
This note was copied from a baby's chart. Lactation Consultation Note  Ex BF.  Denies problems or concerns regarding breastfeeding. Reviewed engorgement care and monitoring voids/stools.   Patient Name: Monica Mathis ZOXWR'UToday's Date: 09/10/2016 Reason for consult: Follow-up assessment   Maternal Data    Feeding Feeding Type: Breast Fed Length of feed: 10 min  LATCH Score/Interventions                      Lactation Tools Discussed/Used     Consult Status Consult Status: Complete    [name], [name] 09/10/2016, 9:17 AM

## 2016-09-23 NOTE — Discharge Summary (Signed)
Obstetric Discharge Summary Reason for Admission: rupture of membranes Prenatal Procedures: ultrasound Intrapartum Procedures: spontaneous vaginal delivery Postpartum Procedures: none Complications-Operative and Postpartum: none Hemoglobin  Date Value Ref Range Status  09/09/2016 11.2 (L) 12.0 - 15.0 g/dL Final   HCT  Date Value Ref Range Status  09/09/2016 33.4 (L) 36.0 - 46.0 % Final    Physical Exam:  General: alert and cooperative Lochia: appropriate Uterine Fundus: firm   Discharge Diagnoses: Term Pregnancy-delivered  Discharge Information: Date: 09/23/2016 Activity: pelvic rest Diet: routine Medications: PNV and Ibuprofen Condition: stable Instructions: refer to practice specific booklet Discharge to: home Follow-up Information    CALLAHAN, SIDNEY, DO. Schedule an appointment as soon as possible for a visit in 1 month(s).   Specialty:  Obstetrics and Gynecology Contact information: [address] [place] [address] [phone]           Newborn Data: Live born female  Birth Weight: 6 lb 9 oz (2977 g) APGAR: 9, 9  Home with mother.  ANDERSON,MARK E 09/23/2016, 5:17 PM

## 2018-05-28 ENCOUNTER — Telehealth: Payer: Self-pay | Admitting: Family Medicine

## 2018-05-28 NOTE — Telephone Encounter (Signed)
Spoke with patient. She decided not to make an appt.

## 2018-06-10 ENCOUNTER — Telehealth: Payer: Self-pay | Admitting: Emergency Medicine

## 2018-06-10 NOTE — Telephone Encounter (Signed)
Called and spoke with pt regarding her appt with [facility] on 11/8 with [facility]. [facility] is not accepting NEW pts. I was able to reschedule both a New pt, establish care and a CPE with Dr. Alvy Bimler in November.

## 2018-06-12 ENCOUNTER — Ambulatory Visit: Payer: Self-pay | Admitting: Physician Assistant

## 2018-06-19 ENCOUNTER — Encounter: Payer: Self-pay | Admitting: Emergency Medicine

## 2018-06-19 ENCOUNTER — Other Ambulatory Visit: Payer: Self-pay

## 2018-06-19 ENCOUNTER — Ambulatory Visit (INDEPENDENT_AMBULATORY_CARE_PROVIDER_SITE_OTHER): Payer: BLUE CROSS/BLUE SHIELD | Admitting: Emergency Medicine

## 2018-06-19 DIAGNOSIS — Z13220 Encounter for screening for lipoid disorders: Secondary | ICD-10-CM

## 2018-06-19 DIAGNOSIS — Z13228 Encounter for screening for other metabolic disorders: Secondary | ICD-10-CM | POA: Diagnosis not present

## 2018-06-19 DIAGNOSIS — Z0000 Encounter for general adult medical examination without abnormal findings: Secondary | ICD-10-CM | POA: Diagnosis not present

## 2018-06-19 DIAGNOSIS — Z130 Encounter for screening for diseases of the blood and blood-forming organs and certain disorders involving the immune mechanism: Secondary | ICD-10-CM

## 2018-06-19 DIAGNOSIS — Z1329 Encounter for screening for other suspected endocrine disorder: Secondary | ICD-10-CM | POA: Diagnosis not present

## 2018-06-19 LAB — CBC WITH DIFFERENTIAL/PLATELET
Basophils Absolute: 0 10*3/uL (ref 0.0–0.2)
Basos: 1 %
EOS (ABSOLUTE): 0.2 10*3/uL (ref 0.0–0.4)
Eos: 5 %
Hematocrit: 36 % (ref 34.0–46.6)
Hemoglobin: 12.1 g/dL (ref 11.1–15.9)
Immature Grans (Abs): 0 10*3/uL (ref 0.0–0.1)
Immature Granulocytes: 0 %
Lymphocytes Absolute: 1.9 10*3/uL (ref 0.7–3.1)
Lymphs: 45 %
MCH: 27.8 pg (ref 26.6–33.0)
MCHC: 33.6 g/dL (ref 31.5–35.7)
MCV: 83 fL (ref 79–97)
Monocytes Absolute: 0.3 10*3/uL (ref 0.1–0.9)
Monocytes: 7 %
Neutrophils Absolute: 1.8 10*3/uL (ref 1.4–7.0)
Neutrophils: 42 %
Platelets: 158 10*3/uL (ref 150–450)
RBC: 4.35 x10E6/uL (ref 3.77–5.28)
RDW: 12.5 % (ref 12.3–15.4)
WBC: 4.2 10*3/uL (ref 3.4–10.8)

## 2018-06-19 LAB — COMPREHENSIVE METABOLIC PANEL
ALT: 12 IU/L (ref 0–32)
AST: 13 IU/L (ref 0–40)
Albumin/Globulin Ratio: 2.2 (ref 1.2–2.2)
Albumin: 4.7 g/dL (ref 3.5–5.5)
Alkaline Phosphatase: 76 IU/L (ref 39–117)
BUN/Creatinine Ratio: 27 — ABNORMAL HIGH (ref 9–23)
BUN: 16 mg/dL (ref 6–20)
Bilirubin Total: 0.6 mg/dL (ref 0.0–1.2)
CO2: 26 mmol/L (ref 20–29)
Calcium: 9.5 mg/dL (ref 8.7–10.2)
Chloride: 100 mmol/L (ref 96–106)
Creatinine, Ser: 0.59 mg/dL (ref 0.57–1.00)
GFR calc Af Amer: 138 mL/min/{1.73_m2} (ref >59)
GFR calc non Af Amer: 120 mL/min/{1.73_m2} (ref >59)
Globulin, Total: 2.1 g/dL (ref 1.5–4.5)
Glucose: 83 mg/dL (ref 65–99)
Potassium: 4 mmol/L (ref 3.5–5.2)
Sodium: 139 mmol/L (ref 134–144)
Total Protein: 6.8 g/dL (ref 6.0–8.5)

## 2018-06-19 LAB — HEMOGLOBIN A1C
Est. average glucose Bld gHb Est-mCnc: 97 mg/dL
Hgb A1c MFr Bld: 5 % (ref 4.8–5.6)

## 2018-06-19 LAB — LIPID PANEL
Chol/HDL Ratio: 3 ratio (ref 0.0–4.4)
Cholesterol, Total: 182 mg/dL (ref 100–199)
HDL: 60 mg/dL (ref >39)
LDL Calculated: 108 mg/dL — ABNORMAL HIGH (ref 0–99)
Triglycerides: 69 mg/dL (ref 0–149)
VLDL Cholesterol Cal: 14 mg/dL (ref 5–40)

## 2018-06-19 NOTE — Patient Instructions (Addendum)
If you have lab work done today you will be contacted with your lab results within the next 2 weeks.  If you have not heard from [place] then please contact us. The fastest way to get your results is to register for My Chart.   IF you received an x-ray today, you will receive an invoice from [facility]. Please contact [facility] at [phone] with questions or concerns regarding your invoice.   IF you received labwork today, you will receive an invoice from [place]. Please contact [facility] at [phone] with questions or concerns regarding your invoice.   Our billing staff will not be able to assist you with questions regarding bills from these companies.  You will be contacted with the lab results as soon as they are available. The fastest way to get your results is to activate your My Chart account. Instructions are located on the last page of this paperwork. If you have not heard from [place] regarding the results in 2 weeks, please contact this office.     Health Maintenance, Female Adopting a healthy lifestyle and getting preventive care can go a long way to promote health and wellness. Talk with your health care provider about what schedule of regular examinations is right for you. This is a good chance for you to check in with your provider about disease prevention and staying healthy. In between checkups, there are plenty of things you can do on your own. Experts have done a lot of research about which lifestyle changes and preventive measures are most likely to keep you healthy. Ask your health care provider for more information. Weight and diet Eat a healthy diet  Be sure to include plenty of vegetables, fruits, low-fat dairy products, and lean protein.  Do not eat a lot of foods high in solid fats, added sugars, or salt.  Get regular exercise. This is one of the most important things you can do for your health. ? Most adults should exercise for at least 150  minutes each week. The exercise should increase your heart rate and make you sweat (moderate-intensity exercise). ? Most adults should also do strengthening exercises at least twice a week. This is in addition to the moderate-intensity exercise.  Maintain a healthy weight  Body mass index (BMI) is a measurement that can be used to identify possible weight problems. It estimates body fat based on height and weight. Your health care provider can help determine your BMI and help you achieve or maintain a healthy weight.  For females 22 years of age and older: ? A BMI below 18.5 is considered underweight. ? A BMI of 18.5 to 24.9 is normal. ? A BMI of 25 to 29.9 is considered overweight. ? A BMI of 30 and above is considered obese.  Watch levels of cholesterol and blood lipids  You should start having your blood tested for lipids and cholesterol at 34 years of age, then have this test every 5 years.  You may need to have your cholesterol levels checked more often if: ? Your lipid or cholesterol levels are high. ? You are older than 34 years of age. ? You are at high risk for heart disease.  Cancer screening Lung Cancer  Lung cancer screening is recommended for adults 58-34 years old who are at high risk for lung cancer because of a history of smoking.  A yearly low-dose CT scan of the lungs is recommended for people who: ? Currently smoke. ? Have quit  within the past 15 years. ? Have at least a 30-pack-year history of smoking. A pack year is smoking an average of one pack of cigarettes a day for 1 year.  Yearly screening should continue until it has been 15 years since you quit.  Yearly screening should stop if you develop a health problem that would prevent you from having lung cancer treatment.  Breast Cancer  Practice breast self-awareness. This means understanding how your breasts normally appear and feel.  It also means doing regular breast self-exams. Let your health care  provider know about any changes, no matter how small.  If you are in your 20s or 30s, you should have a clinical breast exam (CBE) by a health care provider every 1-3 years as part of a regular health exam.  If you are 48 or older, have a CBE every year. Also consider having a breast X-ray (mammogram) every year.  If you have a family history of breast cancer, talk to your health care provider about genetic screening.  If you are at high risk for breast cancer, talk to your health care provider about having an MRI and a mammogram every year.  Breast cancer gene (BRCA) assessment is recommended for women who have family members with BRCA-related cancers. BRCA-related cancers include: ? Breast. ? Ovarian. ? Tubal. ? Peritoneal cancers.  Results of the assessment will determine the need for genetic counseling and BRCA1 and BRCA2 testing.  Cervical Cancer Your health care provider may recommend that you be screened regularly for cancer of the pelvic organs (ovaries, uterus, and vagina). This screening involves a pelvic examination, including checking for microscopic changes to the surface of your cervix (Pap test). You may be encouraged to have this screening done every 3 years, beginning at age 8.  For women ages 8-65, health care providers may recommend pelvic exams and Pap testing every 3 years, or they may recommend the Pap and pelvic exam, combined with testing for human papilloma virus (HPV), every 5 years. Some types of HPV increase your risk of cervical cancer. Testing for HPV may also be done on women of any age with unclear Pap test results.  Other health care providers may not recommend any screening for nonpregnant women who are considered low risk for pelvic cancer and who do not have symptoms. Ask your health care provider if a screening pelvic exam is right for you.  If you have had past treatment for cervical cancer or a condition that could lead to cancer, you need Pap tests  and screening for cancer for at least 20 years after your treatment. If Pap tests have been discontinued, your risk factors (such as having a new sexual partner) need to be reassessed to determine if screening should resume. Some women have medical problems that increase the chance of getting cervical cancer. In these cases, your health care provider may recommend more frequent screening and Pap tests.  Colorectal Cancer  This type of cancer can be detected and often prevented.  Routine colorectal cancer screening usually begins at 34 years of age and continues through 34 years of age.  Your health care provider may recommend screening at an earlier age if you have risk factors for colon cancer.  Your health care provider may also recommend using home test kits to check for hidden blood in the stool.  A small camera at the end of a tube can be used to examine your colon directly (sigmoidoscopy or colonoscopy). This is done to check  for the earliest forms of colorectal cancer.  Routine screening usually begins at age 75.  Direct examination of the colon should be repeated every 5-10 years through 34 years of age. However, you may need to be screened more often if early forms of precancerous polyps or small growths are found.  Skin Cancer  Check your skin from head to toe regularly.  Tell your health care provider about any new moles or changes in moles, especially if there is a change in a mole's shape or color.  Also tell your health care provider if you have a mole that is larger than the size of a pencil eraser.  Always use sunscreen. Apply sunscreen liberally and repeatedly throughout the day.  Protect yourself by wearing long sleeves, pants, a wide-brimmed hat, and sunglasses whenever you are outside.  Heart disease, diabetes, and high blood pressure  High blood pressure causes heart disease and increases the risk of stroke. High blood pressure is more likely to develop  in: ? People who have blood pressure in the high end of the normal range (130-139/85-89 mm Hg). ? People who are overweight or obese. ? People who are African American.  If you are 41-67 years of age, have your blood pressure checked every 3-5 years. If you are 32 years of age or older, have your blood pressure checked every year. You should have your blood pressure measured twice-once when you are at a hospital or clinic, and once when you are not at a hospital or clinic. Record the average of the two measurements. To check your blood pressure when you are not at a hospital or clinic, you can use: ? An automated blood pressure machine at a pharmacy. ? A home blood pressure monitor.  If you are between 44 years and 70 years old, ask your health care provider if you should take aspirin to prevent strokes.  Have regular diabetes screenings. This involves taking a blood sample to check your fasting blood sugar level. ? If you are at a normal weight and have a low risk for diabetes, have this test once every three years after 34 years of age. ? If you are overweight and have a high risk for diabetes, consider being tested at a younger age or more often. Preventing infection Hepatitis B  If you have a higher risk for hepatitis B, you should be screened for this virus. You are considered at high risk for hepatitis B if: ? You were born in a country where hepatitis B is common. Ask your health care provider which countries are considered high risk. ? Your parents were born in a high-risk country, and you have not been immunized against hepatitis B (hepatitis B vaccine). ? You have HIV or AIDS. ? You use needles to inject street drugs. ? You live with someone who has hepatitis B. ? You have had sex with someone who has hepatitis B. ? You get hemodialysis treatment. ? You take certain medicines for conditions, including cancer, organ transplantation, and autoimmune conditions.  Hepatitis C  Blood  testing is recommended for: ? Everyone born from [birth year] through 1967. ? Anyone with known risk factors for hepatitis C.  Sexually transmitted infections (STIs)  You should be screened for sexually transmitted infections (STIs) including gonorrhea and chlamydia if: ? You are sexually active and are younger than 34 years of age. ? You are older than 34 years of age and your health care provider tells you that you are at risk for  this type of infection. ? Your sexual activity has changed since you were last screened and you are at an increased risk for chlamydia or gonorrhea. Ask your health care provider if you are at risk.  If you do not have HIV, but are at risk, it may be recommended that you take a prescription medicine daily to prevent HIV infection. This is called pre-exposure prophylaxis (PrEP). You are considered at risk if: ? You are sexually active and do not regularly use condoms or know the HIV status of your partner(s). ? You take drugs by injection. ? You are sexually active with a partner who has HIV.  Talk with your health care provider about whether you are at high risk of being infected with HIV. If you choose to begin PrEP, you should first be tested for HIV. You should then be tested every 3 months for as long as you are taking PrEP. Pregnancy  If you are premenopausal and you may become pregnant, ask your health care provider about preconception counseling.  If you may become pregnant, take 400 to 800 micrograms (mcg) of folic acid every day.  If you want to prevent pregnancy, talk to your health care provider about birth control (contraception). Osteoporosis and menopause  Osteoporosis is a disease in which the bones lose minerals and strength with aging. This can result in serious bone fractures. Your risk for osteoporosis can be identified using a bone density scan.  If you are 68 years of age or older, or if you are at risk for osteoporosis and fractures, ask your  health care provider if you should be screened.  Ask your health care provider whether you should take a calcium or vitamin D supplement to lower your risk for osteoporosis.  Menopause may have certain physical symptoms and risks.  Hormone replacement therapy may reduce some of these symptoms and risks. Talk to your health care provider about whether hormone replacement therapy is right for you. Follow these instructions at home:  Schedule regular health, dental, and eye exams.  Stay current with your immunizations.  Do not use any tobacco products including cigarettes, chewing tobacco, or electronic cigarettes.  If you are pregnant, do not drink alcohol.  If you are breastfeeding, limit how much and how often you drink alcohol.  Limit alcohol intake to no more than 1 drink per day for nonpregnant women. One drink equals 12 ounces of beer, 5 ounces of wine, or 1 ounces of hard liquor.  Do not use street drugs.  Do not share needles.  Ask your health care provider for help if you need support or information about quitting drugs.  Tell your health care provider if you often feel depressed.  Tell your health care provider if you have ever been abused or do not feel safe at home. This information is not intended to replace advice given to you by your health care provider. Make sure you discuss any questions you have with your health care provider. Document Released: 02/04/2011 Document Revised: 12/28/2015 Document Reviewed: 04/25/2015 [employer]vier Interactive Patient Education  [employer].

## 2018-06-19 NOTE — Progress Notes (Signed)
Monica Mathis 32 y.o.   Chief Complaint  Patient presents with  . Establish Care  . Annual Exam    HISTORY OF PRESENT ILLNESS: This is a 32 y.o. female here for annual.  Has no complaints or medical concerns. Denies any chronic medical problems.  No chronic medications. Healthy lifestyle.  Mother of 3.  Non-smoker.  [employer] user. Adequate nutrition and physical activity. Up-to-date with her GYN medical care.  HPI   Prior to Admission medications   Medication Sig Start Date End Date Taking? Authorizing Provider  ibuprofen (ADVIL,MOTRIN) 600 MG tablet Take 1 tablet (600 mg total) by mouth every 6 (six) hours. 09/10/16  Yes Olga Millers, MD  Prenatal Vit-Fe Fumarate-FA (PRENATAL MULTIVITAMIN) TABS tablet Take 1 tablet by mouth at bedtime.     [provider]    No Known Allergies  There are no active problems to display for this patient.   Past Medical History:  Diagnosis Date  . Medical history non-contributory     Past Surgical History:  Procedure Laterality Date  . NO PAST SURGERIES      Social History   Socioeconomic History  . Marital status: Married    Spouse name: Not on file  . Number of children: Not on file  . Years of education: Not on file  . Highest education level: Not on file  Occupational History  . Not on file  Social Needs  . Financial resource strain: Not on file  . Food insecurity:    Worry: Not on file    Inability: Not on file  . Transportation needs:    Medical: Not on file    Non-medical: Not on file  Tobacco Use  . Smoking status: Never Smoker  . Smokeless tobacco: Never Used  Substance and Sexual Activity  . Alcohol use: No  . Drug use: No  . Sexual activity: Yes  Lifestyle  . Physical activity:    Days per week: Not on file    Minutes per session: Not on file  . Stress: Not on file  Relationships  . Social connections:    Talks on phone: Not on file    Gets together: Not on file    Attends religious  service: Not on file    Active member of club or organization: Not on file    Attends meetings of clubs or organizations: Not on file    Relationship status: Not on file  . Intimate partner violence:    Fear of current or ex partner: Not on file    Emotionally abused: Not on file    Physically abused: Not on file    Forced sexual activity: Not on file  Other Topics Concern  . Not on file  Social History Narrative  . Not on file    No family history on file.   Review of Systems  Constitutional: Negative.  Negative for chills, fever and weight loss.  HENT: Negative for congestion, hearing loss and sore throat.   Eyes: Negative.  Negative for blurred vision and double vision.  Respiratory: Negative.  Negative for cough.   Cardiovascular: Negative.  Negative for chest pain and palpitations.  Gastrointestinal: Negative.  Negative for abdominal pain, diarrhea, nausea and vomiting.  Genitourinary: Negative.  Negative for dysuria and hematuria.  Musculoskeletal: Negative.  Negative for back pain, joint pain, myalgias and neck pain.  Skin: Negative.  Negative for rash.  Neurological: Negative.  Negative for dizziness and headaches.  Endo/Heme/Allergies: Negative.   All  other systems reviewed and are negative.  Vitals:   06/19/18 0823  BP: 98/62  Pulse: 69  Resp: 16  Temp: 98.1 F (36.7 C)  SpO2: 99%     Physical Exam  Constitutional: She is oriented to person, place, and time. She appears well-developed and well-nourished.  HENT:  Head: Normocephalic and atraumatic.  Nose: Nose normal.  Mouth/Throat: Oropharynx is clear and moist.  Eyes: Pupils are equal, round, and reactive to light. Conjunctivae and EOM are normal.  Neck: Normal range of motion. Neck supple. No thyromegaly present.  Cardiovascular: Normal rate, regular rhythm and normal heart sounds.  Pulmonary/Chest: Effort normal and breath sounds normal.  Abdominal: Soft. Bowel sounds are normal. She exhibits no  distension. There is no tenderness.  Musculoskeletal: Normal range of motion.  Lymphadenopathy:    She has no cervical adenopathy.  Neurological: She is alert and oriented to person, place, and time. No sensory deficit. She exhibits normal muscle tone. Coordination normal.  Skin: Skin is warm and dry. Capillary refill takes less than 2 seconds.  Psychiatric: She has a normal mood and affect. Her behavior is normal.  Vitals reviewed.    ASSESSMENT & PLAN: Monica Mathis was seen today for establish care and annual exam.  Diagnoses and all orders for this visit:  Routine general medical examination at a health care facility  Screening for lipoid disorders -     Lipid panel  Screening for endocrine, metabolic and immunity disorder -     CBC with Differential -     Comprehensive metabolic panel -     Hemoglobin A1c -     Lipid panel   Patient Instructions       If you have lab work done today you will be contacted with your lab results within the next 2 weeks.  If you have not heard from [place] then please contact us. The fastest way to get your results is to register for My Chart.   IF you received an x-ray today, you will receive an invoice from [facility]. Please contact [facility] at [phone] with questions or concerns regarding your invoice.   IF you received labwork today, you will receive an invoice from [facility]. Please contact [facility] at [phone] with questions or concerns regarding your invoice.   Our billing staff will not be able to assist you with questions regarding bills from these companies.  You will be contacted with the lab results as soon as they are available. The fastest way to get your results is to activate your My Chart account. Instructions are located on the last page of this paperwork. If you have not heard from [place] regarding the results in 2 weeks, please contact this office.     Health Maintenance, Female Adopting a healthy  lifestyle and getting preventive care can go a long way to promote health and wellness. Talk with your health care provider about what schedule of regular examinations is right for you. This is a good chance for you to check in with your provider about disease prevention and staying healthy. In between checkups, there are plenty of things you can do on your own. Experts have done a lot of research about which lifestyle changes and preventive measures are most likely to keep you healthy. Ask your health care provider for more information. Weight and diet Eat a healthy diet  Be sure to include plenty of vegetables, fruits, low-fat dairy products, and lean protein.  Do not eat a lot of foods  high in solid fats, added sugars, or salt.  Get regular exercise. This is one of the most important things you can do for your health. ? Most adults should exercise for at least 150 minutes each week. The exercise should increase your heart rate and make you sweat (moderate-intensity exercise). ? Most adults should also do strengthening exercises at least twice a week. This is in addition to the moderate-intensity exercise.  Maintain a healthy weight  Body mass index (BMI) is a measurement that can be used to identify possible weight problems. It estimates body fat based on height and weight. Your health care provider can help determine your BMI and help you achieve or maintain a healthy weight.  For females 20 years of age and older: ? A BMI below 18.5 is considered underweight. ? A BMI of 18.5 to 24.9 is normal. ? A BMI of 25 to 29.9 is considered overweight. ? A BMI of 30 and above is considered obese.  Watch levels of cholesterol and blood lipids  You should start having your blood tested for lipids and cholesterol at 34 years of age, then have this test every 5 years.  You may need to have your cholesterol levels checked more often if: ? Your lipid or cholesterol levels are high. ? You are older  than 34 years of age. ? You are at high risk for heart disease.  Cancer screening Lung Cancer  Lung cancer screening is recommended for adults 7-67 years old who are at high risk for lung cancer because of a history of smoking.  A yearly low-dose CT scan of the lungs is recommended for people who: ? Currently smoke. ? Have quit within the past 15 years. ? Have at least a 30-pack-year history of smoking. A pack year is smoking an average of one pack of cigarettes a day for 1 year.  Yearly screening should continue until it has been 15 years since you quit.  Yearly screening should stop if you develop a health problem that would prevent you from having lung cancer treatment.  Breast Cancer  Practice breast self-awareness. This means understanding how your breasts normally appear and feel.  It also means doing regular breast self-exams. Let your health care provider know about any changes, no matter how small.  If you are in your 20s or 30s, you should have a clinical breast exam (CBE) by a health care provider every 1-3 years as part of a regular health exam.  If you are 1 or older, have a CBE every year. Also consider having a breast X-ray (mammogram) every year.  If you have a family history of breast cancer, talk to your health care provider about genetic screening.  If you are at high risk for breast cancer, talk to your health care provider about having an MRI and a mammogram every year.  Breast cancer gene (BRCA) assessment is recommended for women who have family members with BRCA-related cancers. BRCA-related cancers include: ? Breast. ? Ovarian. ? Tubal. ? Peritoneal cancers.  Results of the assessment will determine the need for genetic counseling and BRCA1 and BRCA2 testing.  Cervical Cancer Your health care provider may recommend that you be screened regularly for cancer of the pelvic organs (ovaries, uterus, and vagina). This screening involves a pelvic  examination, including checking for microscopic changes to the surface of your cervix (Pap test). You may be encouraged to have this screening done every 3 years, beginning at age 26.  For women ages 24-65,  health care providers may recommend pelvic exams and Pap testing every 3 years, or they may recommend the Pap and pelvic exam, combined with testing for human papilloma virus (HPV), every 5 years. Some types of HPV increase your risk of cervical cancer. Testing for HPV may also be done on women of any age with unclear Pap test results.  Other health care providers may not recommend any screening for nonpregnant women who are considered low risk for pelvic cancer and who do not have symptoms. Ask your health care provider if a screening pelvic exam is right for you.  If you have had past treatment for cervical cancer or a condition that could lead to cancer, you need Pap tests and screening for cancer for at least 20 years after your treatment. If Pap tests have been discontinued, your risk factors (such as having a new sexual partner) need to be reassessed to determine if screening should resume. Some women have medical problems that increase the chance of getting cervical cancer. In these cases, your health care provider may recommend more frequent screening and Pap tests.  Colorectal Cancer  This type of cancer can be detected and often prevented.  Routine colorectal cancer screening usually begins at 34 years of age and continues through 34 years of age.  Your health care provider may recommend screening at an earlier age if you have risk factors for colon cancer.  Your health care provider may also recommend using home test kits to check for hidden blood in the stool.  A small camera at the end of a tube can be used to examine your colon directly (sigmoidoscopy or colonoscopy). This is done to check for the earliest forms of colorectal cancer.  Routine screening usually begins at age  51.  Direct examination of the colon should be repeated every 5-10 years through 34 years of age. However, you may need to be screened more often if early forms of precancerous polyps or small growths are found.  Skin Cancer  Check your skin from head to toe regularly.  Tell your health care provider about any new moles or changes in moles, especially if there is a change in a mole's shape or color.  Also tell your health care provider if you have a mole that is larger than the size of a pencil eraser.  Always use sunscreen. Apply sunscreen liberally and repeatedly throughout the day.  Protect yourself by wearing long sleeves, pants, a wide-brimmed hat, and sunglasses whenever you are outside.  Heart disease, diabetes, and high blood pressure  High blood pressure causes heart disease and increases the risk of stroke. High blood pressure is more likely to develop in: ? People who have blood pressure in the high end of the normal range (130-139/85-89 mm Hg). ? People who are overweight or obese. ? People who are African American.  If you are 7-57 years of age, have your blood pressure checked every 3-5 years. If you are 15 years of age or older, have your blood pressure checked every year. You should have your blood pressure measured twice-once when you are at a hospital or clinic, and once when you are not at a hospital or clinic. Record the average of the two measurements. To check your blood pressure when you are not at a hospital or clinic, you can use: ? An automated blood pressure machine at a pharmacy. ? A home blood pressure monitor.  If you are between 73 years and 28 years old, ask  your health care provider if you should take aspirin to prevent strokes.  Have regular diabetes screenings. This involves taking a blood sample to check your fasting blood sugar level. ? If you are at a normal weight and have a low risk for diabetes, have this test once every three years after 34  years of age. ? If you are overweight and have a high risk for diabetes, consider being tested at a younger age or more often. Preventing infection Hepatitis B  If you have a higher risk for hepatitis B, you should be screened for this virus. You are considered at high risk for hepatitis B if: ? You were born in a country where hepatitis B is common. Ask your health care provider which countries are considered high risk. ? Your parents were born in a high-risk country, and you have not been immunized against hepatitis B (hepatitis B vaccine). ? You have HIV or AIDS. ? You use needles to inject street drugs. ? You live with someone who has hepatitis B. ? You have had sex with someone who has hepatitis B. ? You get hemodialysis treatment. ? You take certain medicines for conditions, including cancer, organ transplantation, and autoimmune conditions.  Hepatitis C  Blood testing is recommended for: ? Everyone born from [birth year] through 1967. ? Anyone with known risk factors for hepatitis C.  Sexually transmitted infections (STIs)  You should be screened for sexually transmitted infections (STIs) including gonorrhea and chlamydia if: ? You are sexually active and are younger than 34 years of age. ? You are older than 34 years of age and your health care provider tells you that you are at risk for this type of infection. ? Your sexual activity has changed since you were last screened and you are at an increased risk for chlamydia or gonorrhea. Ask your health care provider if you are at risk.  If you do not have HIV, but are at risk, it may be recommended that you take a prescription medicine daily to prevent HIV infection. This is called pre-exposure prophylaxis (PrEP). You are considered at risk if: ? You are sexually active and do not regularly use condoms or know the HIV status of your partner(s). ? You take drugs by injection. ? You are sexually active with a partner who has HIV.  Talk  with your health care provider about whether you are at high risk of being infected with HIV. If you choose to begin PrEP, you should first be tested for HIV. You should then be tested every 3 months for as long as you are taking PrEP. Pregnancy  If you are premenopausal and you may become pregnant, ask your health care provider about preconception counseling.  If you may become pregnant, take 400 to 800 micrograms (mcg) of folic acid every day.  If you want to prevent pregnancy, talk to your health care provider about birth control (contraception). Osteoporosis and menopause  Osteoporosis is a disease in which the bones lose minerals and strength with aging. This can result in serious bone fractures. Your risk for osteoporosis can be identified using a bone density scan.  If you are 41 years of age or older, or if you are at risk for osteoporosis and fractures, ask your health care provider if you should be screened.  Ask your health care provider whether you should take a calcium or vitamin D supplement to lower your risk for osteoporosis.  Menopause may have certain physical symptoms and risks.  Hormone  replacement therapy may reduce some of these symptoms and risks. Talk to your health care provider about whether hormone replacement therapy is right for you. Follow these instructions at home:  Schedule regular health, dental, and eye exams.  Stay current with your immunizations.  Do not use any tobacco products including cigarettes, chewing tobacco, or electronic cigarettes.  If you are pregnant, do not drink alcohol.  If you are breastfeeding, limit how much and how often you drink alcohol.  Limit alcohol intake to no more than 1 drink per day for nonpregnant women. One drink equals 12 ounces of beer, 5 ounces of wine, or 1 ounces of hard liquor.  Do not use street drugs.  Do not share needles.  Ask your health care provider for help if you need support or information  about quitting drugs.  Tell your health care provider if you often feel depressed.  Tell your health care provider if you have ever been abused or do not feel safe at home. This information is not intended to replace advice given to you by your health care provider. Make sure you discuss any questions you have with your health care provider. Document Released: 02/04/2011 Document Revised: 12/28/2015 Document Reviewed: 04/25/2015 Elsevier Interactive Patient Education  2018 Elsevier Inc.      Agustina Caroli, MD [facility]

## 2018-06-22 ENCOUNTER — Encounter: Payer: Self-pay | Admitting: *Deleted

## 2018-06-29 ENCOUNTER — Encounter: Payer: Medicaid Other | Admitting: Emergency Medicine

## 2019-08-06 NOTE — L&D Delivery Note (Signed)
Delivery Note Monica Mathis is a [id number] at [redacted]w[redacted]d who had a spontaneous delivery on 02/29/20 at 09:18 a viable female was delivered via ROA.  APGAR: 9, 9; weight 7lb7.8oz (3396g).   Admitted for labor, progressed from 5cm to 10cm spontaneously. Received an epidural for pain management. SROM with pushing. Pushed for 18 minutes. Baby was delivered without difficulty, 1 nuchal cord easily reduced. Placed on maternal abdomen. Delayed cord clamping for 120 seconds. Delivery of placenta was spontaneous. Placenta was found to be intact, 3 -vessel cord was noted. The fundus was found to be firm, 800 mcg of rectal miso given prophylactic.  1st degree perineal laceration was hemostatic without repair. Estimated blood loss 100cc. Instrument and gauze counts were correct at the end of the procedure. Placenta status: L&D Mom to postpartum.  Baby to Couplet care / Skin to Skin.  Kaitrin Seybold K Taam-Akelman 02/29/2020, 9:39 AM

## 2019-08-26 LAB — OB RESULTS CONSOLE RPR: RPR: NONREACTIVE

## 2019-08-26 LAB — OB RESULTS CONSOLE GC/CHLAMYDIA
Chlamydia: NEGATIVE
Gonorrhea: NEGATIVE

## 2019-08-26 LAB — OB RESULTS CONSOLE HIV ANTIBODY (ROUTINE TESTING): HIV: NONREACTIVE

## 2019-08-26 LAB — OB RESULTS CONSOLE ABO/RH: RH Type: POSITIVE

## 2019-08-26 LAB — OB RESULTS CONSOLE HEPATITIS B SURFACE ANTIGEN: Hepatitis B Surface Ag: NEGATIVE

## 2019-08-26 LAB — OB RESULTS CONSOLE ANTIBODY SCREEN: Antibody Screen: NEGATIVE

## 2019-08-26 LAB — OB RESULTS CONSOLE RUBELLA ANTIBODY, IGM: Rubella: IMMUNE

## 2020-02-03 LAB — OB RESULTS CONSOLE GBS: GBS: POSITIVE

## 2020-02-11 ENCOUNTER — Telehealth (HOSPITAL_COMMUNITY): Payer: Self-pay | Admitting: *Deleted

## 2020-02-11 NOTE — Telephone Encounter (Signed)
Preadmission screen  

## 2020-02-16 ENCOUNTER — Encounter (HOSPITAL_COMMUNITY): Payer: Self-pay | Admitting: *Deleted

## 2020-02-16 NOTE — Telephone Encounter (Signed)
Preadmission screen  

## 2020-02-17 ENCOUNTER — Encounter (HOSPITAL_COMMUNITY): Payer: Self-pay | Admitting: *Deleted

## 2020-02-23 ENCOUNTER — Other Ambulatory Visit (HOSPITAL_COMMUNITY)
Admission: RE | Admit: 2020-02-23 | Discharge: 2020-02-23 | Disposition: A | Discharge status: Home / Self Care | Payer: BC Managed Care – PPO | Source: Ambulatory Visit | Attending: Obstetrics and Gynecology | Admitting: Obstetrics and Gynecology

## 2020-02-23 DIAGNOSIS — Z20822 Contact with and (suspected) exposure to covid-19: Secondary | ICD-10-CM | POA: Insufficient documentation

## 2020-02-23 DIAGNOSIS — Z01812 Encounter for preprocedural laboratory examination: Secondary | ICD-10-CM | POA: Insufficient documentation

## 2020-02-23 LAB — SARS CORONAVIRUS 2 (TAT 6-24 HRS): SARS Coronavirus 2: NEGATIVE

## 2020-02-25 ENCOUNTER — Inpatient Hospital Stay (HOSPITAL_COMMUNITY): Admission: AD | Admit: 2020-02-25 | Payer: 59 | Source: Home / Self Care | Admitting: Obstetrics and Gynecology

## 2020-02-25 ENCOUNTER — Inpatient Hospital Stay (HOSPITAL_COMMUNITY): Payer: 59

## 2020-02-28 ENCOUNTER — Telehealth (HOSPITAL_COMMUNITY): Payer: Self-pay | Admitting: *Deleted

## 2020-02-28 NOTE — Telephone Encounter (Signed)
Preadmission screen  

## 2020-02-29 ENCOUNTER — Inpatient Hospital Stay (HOSPITAL_COMMUNITY): Payer: BC Managed Care – PPO | Admitting: Anesthesiology

## 2020-02-29 ENCOUNTER — Inpatient Hospital Stay (HOSPITAL_COMMUNITY)
Admission: AD | Admit: 2020-02-29 | Discharge: 2020-03-01 | DRG: 806 | Disposition: A | Discharge status: Home / Self Care | Payer: BC Managed Care – PPO | Attending: Obstetrics & Gynecology | Admitting: Obstetrics & Gynecology

## 2020-02-29 ENCOUNTER — Encounter (HOSPITAL_COMMUNITY): Payer: Self-pay | Admitting: Obstetrics and Gynecology

## 2020-02-29 ENCOUNTER — Other Ambulatory Visit: Payer: Self-pay

## 2020-02-29 DIAGNOSIS — O9912 Other diseases of the blood and blood-forming organs and certain disorders involving the immune mechanism complicating childbirth: Secondary | ICD-10-CM | POA: Diagnosis present

## 2020-02-29 DIAGNOSIS — D696 Thrombocytopenia, unspecified: Secondary | ICD-10-CM | POA: Diagnosis present

## 2020-02-29 DIAGNOSIS — Z3A40 40 weeks gestation of pregnancy: Secondary | ICD-10-CM

## 2020-02-29 DIAGNOSIS — O26893 Other specified pregnancy related conditions, third trimester: Secondary | ICD-10-CM | POA: Diagnosis present

## 2020-02-29 DIAGNOSIS — Z20822 Contact with and (suspected) exposure to covid-19: Secondary | ICD-10-CM | POA: Diagnosis present

## 2020-02-29 DIAGNOSIS — O99824 Streptococcus B carrier state complicating childbirth: Secondary | ICD-10-CM | POA: Diagnosis present

## 2020-02-29 LAB — CBC
HCT: 37.7 % (ref 36.0–46.0)
Hemoglobin: 12 g/dL (ref 12.0–15.0)
MCH: 28.3 pg (ref 26.0–34.0)
MCHC: 31.8 g/dL (ref 30.0–36.0)
MCV: 88.9 fL (ref 80.0–100.0)
Platelets: 137 10*3/uL — ABNORMAL LOW (ref 150–400)
RBC: 4.24 MIL/uL (ref 3.87–5.11)
RDW: 14.3 % (ref 11.5–15.5)
WBC: 9.8 10*3/uL (ref 4.0–10.5)
nRBC: 0 % (ref 0.0–0.2)

## 2020-02-29 LAB — TYPE AND SCREEN
ABO/RH(D): O POS
Antibody Screen: NEGATIVE

## 2020-02-29 LAB — SARS CORONAVIRUS 2 BY RT PCR (HOSPITAL ORDER, PERFORMED IN ~~LOC~~ HOSPITAL LAB): SARS Coronavirus 2: NEGATIVE

## 2020-02-29 LAB — RPR: RPR Ser Ql: NONREACTIVE

## 2020-02-29 MED ORDER — PENICILLIN G POT IN DEXTROSE 60000 UNIT/ML IV SOLN
3.0000 10*6.[IU] | INTRAVENOUS | Status: DC
  Administered 2020-02-29: 3 10*6.[IU] via INTRAVENOUS
  Filled 2020-02-29: qty 50

## 2020-02-29 MED ORDER — COCONUT OIL OIL: 1.0000 "application " | TOPICAL_OIL | Status: DC | PRN

## 2020-02-29 MED ORDER — SOD CITRATE-CITRIC ACID 500-334 MG/5ML PO SOLN: 30.0000 mL | ORAL | Status: DC | PRN

## 2020-02-29 MED ORDER — OXYCODONE HCL 5 MG PO TABS: 10.0000 mg | ORAL_TABLET | ORAL | Status: DC | PRN

## 2020-02-29 MED ORDER — SODIUM CHLORIDE (PF) 0.9 % IJ SOLN
INTRAMUSCULAR | Status: DC | PRN
  Administered 2020-02-29: 12 mL/h via EPIDURAL

## 2020-02-29 MED ORDER — LACTATED RINGERS IV SOLN: 500.0000 mL | Freq: Once | INTRAVENOUS | Status: DC

## 2020-02-29 MED ORDER — WITCH HAZEL-GLYCERIN EX PADS: 1.0000 "application " | MEDICATED_PAD | CUTANEOUS | Status: DC | PRN

## 2020-02-29 MED ORDER — FENTANYL CITRATE (PF) 100 MCG/2ML IJ SOLN: 100.0000 ug | INTRAMUSCULAR | Status: DC | PRN

## 2020-02-29 MED ORDER — DIPHENHYDRAMINE HCL 50 MG/ML IJ SOLN: 12.5000 mg | INTRAMUSCULAR | Status: DC | PRN

## 2020-02-29 MED ORDER — IBUPROFEN 600 MG PO TABS
600.0000 mg | ORAL_TABLET | Freq: Four times a day (QID) | ORAL | Status: DC
  Administered 2020-02-29 – 2020-03-01 (×5): 600 mg via ORAL
  Filled 2020-02-29 (×5): qty 1

## 2020-02-29 MED ORDER — SODIUM CHLORIDE 0.9% FLUSH: 3.0000 mL | INTRAVENOUS | Status: DC | PRN

## 2020-02-29 MED ORDER — FLEET ENEMA 7-19 GM/118ML RE ENEM: 1.0000 | ENEMA | RECTAL | Status: DC | PRN

## 2020-02-29 MED ORDER — OXYTOCIN-SODIUM CHLORIDE 30-0.9 UT/500ML-% IV SOLN
INTRAVENOUS | Status: AC
  Filled 2020-02-29: qty 500

## 2020-02-29 MED ORDER — OXYCODONE HCL 5 MG PO TABS: 5.0000 mg | ORAL_TABLET | ORAL | Status: DC | PRN

## 2020-02-29 MED ORDER — OXYCODONE-ACETAMINOPHEN 5-325 MG PO TABS: 1.0000 | ORAL_TABLET | ORAL | Status: DC | PRN

## 2020-02-29 MED ORDER — LACTATED RINGERS IV SOLN
500.0000 mL | INTRAVENOUS | Status: DC | PRN
  Administered 2020-02-29: 500 mL via INTRAVENOUS
  Administered 2020-02-29: 1000 mL via INTRAVENOUS
  Administered 2020-02-29: 500 mL via INTRAVENOUS

## 2020-02-29 MED ORDER — ONDANSETRON HCL 4 MG PO TABS: 4.0000 mg | ORAL_TABLET | ORAL | Status: DC | PRN

## 2020-02-29 MED ORDER — PHENYLEPHRINE 40 MCG/ML (10ML) SYRINGE FOR IV PUSH (FOR BLOOD PRESSURE SUPPORT)
80.0000 ug | PREFILLED_SYRINGE | INTRAVENOUS | Status: DC | PRN
  Filled 2020-02-29: qty 10

## 2020-02-29 MED ORDER — LIDOCAINE HCL (PF) 1 % IJ SOLN: 30.0000 mL | INTRAMUSCULAR | Status: DC | PRN

## 2020-02-29 MED ORDER — DOCUSATE SODIUM 100 MG PO CAPS
100.0000 mg | ORAL_CAPSULE | Freq: Two times a day (BID) | ORAL | Status: DC
  Filled 2020-02-29: qty 1

## 2020-02-29 MED ORDER — ACETAMINOPHEN 325 MG PO TABS: 650.0000 mg | ORAL_TABLET | ORAL | Status: DC | PRN

## 2020-02-29 MED ORDER — EPHEDRINE 5 MG/ML INJ: 10.0000 mg | INTRAVENOUS | Status: DC | PRN

## 2020-02-29 MED ORDER — SODIUM CHLORIDE 0.9 % IV SOLN: 250.0000 mL | INTRAVENOUS | Status: DC | PRN

## 2020-02-29 MED ORDER — ONDANSETRON HCL 4 MG/2ML IJ SOLN: 4.0000 mg | Freq: Four times a day (QID) | INTRAMUSCULAR | Status: DC | PRN

## 2020-02-29 MED ORDER — DIPHENHYDRAMINE HCL 25 MG PO CAPS: 25.0000 mg | ORAL_CAPSULE | Freq: Four times a day (QID) | ORAL | Status: DC | PRN

## 2020-02-29 MED ORDER — SODIUM CHLORIDE 0.9 % IV SOLN
5.0000 10*6.[IU] | Freq: Once | INTRAVENOUS | Status: AC
  Administered 2020-02-29: 5 10*6.[IU] via INTRAVENOUS
  Filled 2020-02-29: qty 5

## 2020-02-29 MED ORDER — OXYCODONE-ACETAMINOPHEN 5-325 MG PO TABS: 2.0000 | ORAL_TABLET | ORAL | Status: DC | PRN

## 2020-02-29 MED ORDER — MISOPROSTOL 200 MCG PO TABS
ORAL_TABLET | ORAL | Status: AC
  Administered 2020-02-29: 800 ug via RECTAL
  Filled 2020-02-29: qty 4

## 2020-02-29 MED ORDER — BENZOCAINE-MENTHOL 20-0.5 % EX AERO: 1.0000 "application " | INHALATION_SPRAY | CUTANEOUS | Status: DC | PRN

## 2020-02-29 MED ORDER — PRENATAL MULTIVITAMIN CH
1.0000 | ORAL_TABLET | Freq: Every day | ORAL | Status: DC
  Administered 2020-03-01: 1 via ORAL
  Filled 2020-02-29 (×2): qty 1

## 2020-02-29 MED ORDER — ONDANSETRON HCL 4 MG/2ML IJ SOLN: 4.0000 mg | INTRAMUSCULAR | Status: DC | PRN

## 2020-02-29 MED ORDER — OXYTOCIN-SODIUM CHLORIDE 30-0.9 UT/500ML-% IV SOLN: 2.5000 [IU]/h | INTRAVENOUS | Status: DC

## 2020-02-29 MED ORDER — DIBUCAINE (PERIANAL) 1 % EX OINT: 1.0000 "application " | TOPICAL_OINTMENT | CUTANEOUS | Status: DC | PRN

## 2020-02-29 MED ORDER — LACTATED RINGERS IV SOLN
INTRAVENOUS | Status: DC
  Administered 2020-02-29: 125 mL via INTRAVENOUS

## 2020-02-29 MED ORDER — SODIUM CHLORIDE 0.9% FLUSH
3.0000 mL | Freq: Two times a day (BID) | INTRAVENOUS | Status: DC
  Administered 2020-02-29: 3 mL via INTRAVENOUS

## 2020-02-29 MED ORDER — OXYTOCIN BOLUS FROM INFUSION
333.0000 mL | Freq: Once | INTRAVENOUS | Status: AC
  Administered 2020-02-29: 333 mL via INTRAVENOUS

## 2020-02-29 MED ORDER — FENTANYL-BUPIVACAINE-NACL 0.5-0.125-0.9 MG/250ML-% EP SOLN
12.0000 mL/h | EPIDURAL | Status: DC | PRN
  Filled 2020-02-29: qty 250

## 2020-02-29 MED ORDER — LIDOCAINE HCL (PF) 1 % IJ SOLN
INTRAMUSCULAR | Status: DC | PRN
  Administered 2020-02-29: 10 mL via EPIDURAL
  Administered 2020-02-29: 2 mL via EPIDURAL

## 2020-02-29 MED ORDER — PHENYLEPHRINE 40 MCG/ML (10ML) SYRINGE FOR IV PUSH (FOR BLOOD PRESSURE SUPPORT)
80.0000 ug | PREFILLED_SYRINGE | INTRAVENOUS | Status: DC | PRN
  Administered 2020-02-29: 80 ug via INTRAVENOUS

## 2020-02-29 MED ORDER — SIMETHICONE 80 MG PO CHEW: 80.0000 mg | CHEWABLE_TABLET | ORAL | Status: DC | PRN

## 2020-02-29 NOTE — Anesthesia Postprocedure Evaluation (Signed)
Anesthesia Post Note  Patient: Scientist, forensic  Procedure(s) Performed: AN AD HOC LABOR EPIDURAL     Patient location during evaluation: Mother Baby Anesthesia Type: Epidural Level of consciousness: awake Pain management: satisfactory to patient Vital Signs Assessment: post-procedure vital signs reviewed and stable Respiratory status: spontaneous breathing Cardiovascular status: stable Anesthetic complications: no   No complications documented.  Last Vitals:  Vitals:   02/29/20 1127 02/29/20 1220  BP: (!) 100/42 (!) 99/51  Pulse: 57 72  Resp: 18 18  Temp: 36.9 C 36.9 C  SpO2: 100% 100%    Last Pain:  Vitals:   02/29/20 1220  TempSrc: Oral  PainSc: 0-No pain   Pain Goal:                   [employer]

## 2020-02-29 NOTE — Anesthesia Procedure Notes (Signed)
Epidural Patient location during procedure: OB Start time: 02/29/2020 3:54 AM End time: 02/29/2020 4:04 AM  Staffing Anesthesiologist: Lannie Fields, DO Performed: anesthesiologist   Preanesthetic Checklist Completed: patient identified, IV checked, risks and benefits discussed, monitors and equipment checked, pre-op evaluation and timeout performed  Epidural Patient position: sitting Prep: DuraPrep and site prepped and draped Patient monitoring: continuous pulse ox, blood pressure, heart rate and cardiac monitor Approach: midline Location: L3-L4 Injection technique: LOR air  Needle:  Needle type: Tuohy  Needle gauge: 17 G Needle length: 9 cm Needle insertion depth: 5.5 cm Catheter type: closed end flexible Catheter size: 19 Gauge Catheter at skin depth: 11 cm Test dose: negative  Assessment Sensory level: T8 Events: blood not aspirated, injection not painful, no injection resistance, no paresthesia and negative IV test  Additional Notes Patient identified. Risks/Benefits/Options discussed with patient including but not limited to bleeding, infection, nerve damage, paralysis, failed block, incomplete pain control, headache, blood pressure changes, nausea, vomiting, reactions to medication both or allergic, itching and postpartum back pain. Confirmed with bedside nurse the patient's most recent platelet count. Confirmed with patient that they are not currently taking any anticoagulation, have any bleeding history or any family history of bleeding disorders. Patient expressed understanding and wished to proceed. All questions were answered. Sterile technique was used throughout the entire procedure. Please see nursing notes for vital signs. Test dose was given through epidural catheter and negative prior to continuing to dose epidural or start infusion. Warning signs of high block given to the patient including shortness of breath, tingling/numbness in hands, complete motor  block, or any concerning symptoms with instructions to call for help. Patient was given instructions on fall risk and not to get out of bed. All questions and concerns addressed with instructions to call with any issues or inadequate analgesia.  Reason for block:procedure for pain

## 2020-02-29 NOTE — Anesthesia Preprocedure Evaluation (Signed)
Anesthesia Evaluation  Patient identified by MRN, date of birth, ID band Patient awake    Reviewed: Allergy & Precautions, H&P , Patient's Chart, lab work & pertinent test results  Airway Mallampati: II  TM Distance: >3 FB Neck ROM: full    Dental no notable dental hx.    Pulmonary neg pulmonary ROS,    Pulmonary exam normal breath sounds clear to auscultation       Cardiovascular negative cardio ROS   Rhythm:regular Rate:Normal     Neuro/Psych negative neurological ROS  negative psych ROS   GI/Hepatic negative GI ROS, Neg liver ROS,   Endo/Other  negative endocrine ROS  Renal/GU negative Renal ROS  negative genitourinary   Musculoskeletal negative musculoskeletal ROS (+)   Abdominal   Peds  Hematology negative hematology ROS (+)   Anesthesia Other Findings   Reproductive/Obstetrics (+) Pregnancy G9P6                             Anesthesia Physical Anesthesia Plan  ASA: II and emergent  Anesthesia Plan: Epidural   Post-op Pain Management:    Induction:   PONV Risk Score and Plan: 2  Airway Management Planned: Natural Airway  Additional Equipment: None  Intra-op Plan:   Post-operative Plan:   Informed Consent: I have reviewed the patients History and Physical, chart, labs and discussed the procedure including the risks, benefits and alternatives for the proposed anesthesia with the patient or authorized representative who has indicated his/her understanding and acceptance.       Plan Discussed with:   Anesthesia Plan Comments:         Anesthesia Quick Evaluation

## 2020-02-29 NOTE — Progress Notes (Signed)
No LC  

## 2020-02-29 NOTE — H&P (Signed)
Monica Mathis is a 34 y.o. female [id number] [redacted]w[redacted]d presenting with contractions. She reports no LOF, VB. Normal FM.   Pregnancy complicated by: 1. Advanced maternal age: normal anatomy scan, declined genetics testing.  2. Grand multiparity  OB History     Gravida  9   Para  6   Term  6   Preterm  0   AB  2   Living  6      SAB  2   TAB  0   Ectopic  0   Multiple  0   Live Births  6          Past Medical History:  Diagnosis Date  . Medical history non-contributory    Past Surgical History:  Procedure Laterality Date  . NO PAST SURGERIES     Family History: family history is not on file. Social History:  reports that she has never smoked. She has never used smokeless tobacco. She reports that she does not drink alcohol and does not use drugs.     Maternal Diabetes: No Genetic Screening: declined Maternal Ultrasounds/Referrals: 3/9/32021 Anatomy normal, anterior placenta Fetal Ultrasounds or other Referrals:  None Maternal Substance Abuse:  No Significant Maternal Medications:  None Significant Maternal Lab Results:  Group B Strep positive Other Comments:  None  Review of Systems Per HPI Exam Physical Exam  Dilation: 5 Effacement (%): 80 Station: -2 Exam by:: Mariah RN Blood pressure (!) 86/60, pulse 78, temperature 98 F (36.7 C), temperature source Oral, resp. rate 18, height 5\' 3"  (1.6 m), weight 77.1 kg, SpO2 100 %, unknown if currently breastfeeding. NAD, resting comfortably with epidural Gravid abdomen Fetal testing: FHR 135, Cat 1 Toco q2-76m Prenatal labs: ABO, Rh:  --/--/O POS (07/27 0200) Antibody: NEG (07/27 0200) Rubella: Immune (01/21 0000) RPR: Nonreactive (01/21 0000)  HBsAg: Negative (01/21 0000)  HIV: Non-reactive (01/21 0000)  GBS: Positive/-- (07/01 0000)   Recent Labs    02/29/20 0159  WBC 9.8  HGB 12.0  HCT 37.7  PLT 137*    Assessment/Plan: Monica Mathis 34 y.o. 03/02/20 at [redacted]w[redacted]d here with labor 1. Labor: on  admit 5/80/-2, with contractions, on exam this AM, 10/100/+2 with BBOW. Anticipate SVD.  2. GBS+, on PCN 3. Grand multiparity: increased risk for PPH, methergine/hemabate/cytotec prn. 4. Thombrocytopenia: plt 137.  Monica Mathis 02/29/2020, 7:47 AM

## 2020-03-01 ENCOUNTER — Other Ambulatory Visit (HOSPITAL_COMMUNITY): Payer: 59

## 2020-03-01 LAB — CBC
HCT: 35.7 % — ABNORMAL LOW (ref 36.0–46.0)
Hemoglobin: 11.3 g/dL — ABNORMAL LOW (ref 12.0–15.0)
MCH: 28.3 pg (ref 26.0–34.0)
MCHC: 31.7 g/dL (ref 30.0–36.0)
MCV: 89.5 fL (ref 80.0–100.0)
Platelets: 120 10*3/uL — ABNORMAL LOW (ref 150–400)
RBC: 3.99 MIL/uL (ref 3.87–5.11)
RDW: 14.5 % (ref 11.5–15.5)
WBC: 11.1 10*3/uL — ABNORMAL HIGH (ref 4.0–10.5)
nRBC: 0 % (ref 0.0–0.2)

## 2020-03-01 NOTE — Discharge Summary (Signed)
Postpartum Discharge Summary      Patient Name: Monica Mathis DOB: [birth year] MRN: [id number]  Date of admission: 02/29/2020 Delivery date:02/29/2020  Delivering provider: Lyda Kalata K  Date of discharge: 03/01/2020  Admitting diagnosis: Normal labor and delivery [O80] Intrauterine pregnancy: [redacted]w[redacted]d    Secondary diagnosis:  Active Problems:   Normal labor and delivery  Additional problems: AMA, grand multiparity, thrombocytopenia    Discharge diagnosis: Term Pregnancy Delivered                                              Post partum procedures:8082m cytotec placed after delivery for bleeding prophylaxis given risk factors Augmentation: N/A Complications: None  Hospital course: Onset of Labor With Vaginal Delivery      [age].o. yo [id number] [id number] admitted in Active Labor on 02/29/2020. Patient had an uncomplicated labor course as follows:  Membrane Rupture Time/Date: 9:12 AM ,02/29/2020   Delivery Method:Vaginal, Spontaneous  Episiotomy: None  Lacerations:  1st degree  Patient had an uncomplicated postpartum course.  She is ambulating, tolerating a regular diet, passing flatus, and urinating well. Patient is discharged home in stable condition on 03/01/20.  Newborn Data: Birth date:02/29/2020  Birth time:9:18 AM  Gender:Female  Living status:Living  Apgars:9 ,9  Weight:3396 g   Magnesium Sulfate received: No BMZ received: No Rhophylac:No MMR:N/A T-DaP:see office notes Flu: N/A Transfusion:No  Physical exam  Vitals:   02/29/20 1613 02/29/20 2034 03/01/20 0032 03/01/20 0523  BP: (!) 95/61 (!) 98/55 (!) 99/52 (!) 90/51  Pulse: 53  69 68  Resp: _0 Temp: 98.2 F (36.8 C) 98.1 F (36.7 C) 98.1 F (36.7 C) 98.2 F (36.8 C)  TempSrc: Oral Oral Oral Oral  SpO2: 100% 99%    Weight:      Height:       General: alert, cooperative and no distress Lochia: appropriate Uterine Fundus: firm DVT Evaluation: No evidence of DVT seen on physical  exam. Labs: Lab Results  Component Value Date   WBC 11.1 (H) 03/01/2020   HGB 11.3 (L) 03/01/2020   HCT 35.7 (L) 03/01/2020   MCV 89.5 03/01/2020   PLT 120 (L) 03/01/2020   CMP Latest Ref Rng & Units 06/19/2018  Glucose 65 - 99 mg/dL 83  BUN 6 - 20 mg/dL 16  Creatinine 0.57 - 1.00 mg/dL 0.59  Sodium 134 - 144 mmol/L 139  Potassium 3.5 - 5.2 mmol/L 4.0  Chloride 96 - 106 mmol/L 100  CO2 20 - 29 mmol/L 26  Calcium 8.7 - 10.2 mg/dL 9.5  Total Protein 6.0 - 8.5 g/dL 6.8  Total Bilirubin 0.0 - 1.2 mg/dL 0.6  Alkaline Phos 39 - 117 IU/L 76  AST 0 - 40 IU/L 13  ALT 0 - 32 IU/L 12   Edinburgh Score: Edinburgh Postnatal Depression Scale Screening Tool 02/29/2020  I have been able to laugh and see the funny side of things. 0  I have looked forward with enjoyment to things. 0  I have blamed myself unnecessarily when things went wrong. 0  I have been anxious or worried for no good reason. 0  I have felt scared or panicky for no good reason. 0  Things have been getting on top of me. 0  I have been so unhappy that I have had difficulty sleeping. 0  I have  felt sad or miserable. 0  I have been so unhappy that I have been crying. 0  The thought of harming myself has occurred to me. 0  Edinburgh Postnatal Depression Scale Total 0      After visit meds:   Discharge home in stable condition Infant Feeding: Breast Infant Disposition:home with mother Discharge instruction: per After Visit Summary and Postpartum booklet. Activity: Advance as tolerated. Pelvic rest for 6 weeks.  Diet: routine diet Anticipated Birth Control: Unsure Postpartum Appointment:4 weeks Additional Postpartum F/U: Postpartum Depression checkup Future Appointments: Future Appointments  Date Time Provider [place]  03/03/2020  8:00 AM [facility] SCHED ROOM [facility] None   Follow up Visit:  Follow-up Information    Taam-Akelman, Lawrence Santiago, MD Follow up in 4 week(s).   Specialty: Obstetrics and  Gynecology Contact information: [place] [address] [phone]                   03/01/2020 Allyn Kenner, DO

## 2020-03-03 ENCOUNTER — Inpatient Hospital Stay (HOSPITAL_COMMUNITY): Payer: BC Managed Care – PPO

## 2022-01-14 DIAGNOSIS — H6122 Impacted cerumen, left ear: Secondary | ICD-10-CM | POA: Diagnosis not present

## 2024-07-06 DIAGNOSIS — Z1231 Encounter for screening mammogram for malignant neoplasm of breast: Secondary | ICD-10-CM | POA: Diagnosis not present

## 2024-07-06 DIAGNOSIS — Z01419 Encounter for gynecological examination (general) (routine) without abnormal findings: Secondary | ICD-10-CM | POA: Diagnosis not present

## 2024-07-06 DIAGNOSIS — Z124 Encounter for screening for malignant neoplasm of cervix: Secondary | ICD-10-CM | POA: Diagnosis not present

## 2024-07-06 DIAGNOSIS — Z3202 Encounter for pregnancy test, result negative: Secondary | ICD-10-CM | POA: Diagnosis not present
